# Patient Record
Sex: Male | Born: 1970 | Race: Black or African American | Hispanic: No | Marital: Single | Smoking: Never smoker
Health system: Southern US, Community
[De-identification: ages and names within clinical notes are randomized; demographics above are authoritative.]

## PROBLEM LIST (undated history)

## (undated) DIAGNOSIS — G4733 Obstructive sleep apnea (adult) (pediatric): Secondary | ICD-10-CM

## (undated) DIAGNOSIS — IMO0002 Reserved for concepts with insufficient information to code with codable children: Secondary | ICD-10-CM

## (undated) DIAGNOSIS — R002 Palpitations: Secondary | ICD-10-CM

## (undated) DIAGNOSIS — K219 Gastro-esophageal reflux disease without esophagitis: Secondary | ICD-10-CM

## (undated) DIAGNOSIS — I340 Nonrheumatic mitral (valve) insufficiency: Secondary | ICD-10-CM

## (undated) DIAGNOSIS — E669 Obesity, unspecified: Secondary | ICD-10-CM

## (undated) DIAGNOSIS — I10 Essential (primary) hypertension: Secondary | ICD-10-CM

## (undated) DIAGNOSIS — B019 Varicella without complication: Secondary | ICD-10-CM

## (undated) DIAGNOSIS — I5189 Other ill-defined heart diseases: Secondary | ICD-10-CM

## (undated) DIAGNOSIS — J45909 Unspecified asthma, uncomplicated: Secondary | ICD-10-CM

## (undated) DIAGNOSIS — F329 Major depressive disorder, single episode, unspecified: Secondary | ICD-10-CM

## (undated) DIAGNOSIS — F32A Depression, unspecified: Secondary | ICD-10-CM

## (undated) DIAGNOSIS — Z9289 Personal history of other medical treatment: Secondary | ICD-10-CM

## (undated) DIAGNOSIS — F419 Anxiety disorder, unspecified: Secondary | ICD-10-CM

## (undated) HISTORY — DX: Major depressive disorder, single episode, unspecified: F32.9

## (undated) HISTORY — DX: Unspecified asthma, uncomplicated: J45.909

## (undated) HISTORY — DX: Varicella without complication: B01.9

## (undated) HISTORY — DX: Obstructive sleep apnea (adult) (pediatric): G47.33

## (undated) HISTORY — DX: Reserved for concepts with insufficient information to code with codable children: IMO0002

## (undated) HISTORY — DX: Nonrheumatic mitral (valve) insufficiency: I34.0

## (undated) HISTORY — DX: Obesity, unspecified: E66.9

## (undated) HISTORY — DX: Gastro-esophageal reflux disease without esophagitis: K21.9

## (undated) HISTORY — DX: Depression, unspecified: F32.A

## (undated) HISTORY — PX: CARDIAC CATHETERIZATION: SHX172

## (undated) HISTORY — DX: Palpitations: R00.2

## (undated) HISTORY — DX: Other ill-defined heart diseases: I51.89

## (undated) HISTORY — DX: Anxiety disorder, unspecified: F41.9

## (undated) HISTORY — DX: Personal history of other medical treatment: Z92.89

## (undated) HISTORY — DX: Essential (primary) hypertension: I10

---

## 1997-07-19 ENCOUNTER — Emergency Department (HOSPITAL_COMMUNITY): Admission: EM | Admit: 1997-07-19 | Discharge: 1997-07-19 | Payer: Self-pay | Admitting: Emergency Medicine

## 2000-12-01 ENCOUNTER — Ambulatory Visit (HOSPITAL_BASED_OUTPATIENT_CLINIC_OR_DEPARTMENT_OTHER): Admission: RE | Admit: 2000-12-01 | Discharge: 2000-12-01 | Payer: Self-pay | Admitting: Otolaryngology

## 2002-11-24 ENCOUNTER — Emergency Department (HOSPITAL_COMMUNITY): Admission: EM | Admit: 2002-11-24 | Discharge: 2002-11-24 | Payer: Self-pay

## 2004-11-19 ENCOUNTER — Encounter: Admission: RE | Admit: 2004-11-19 | Discharge: 2004-11-19 | Payer: Self-pay | Admitting: Nephrology

## 2005-03-05 ENCOUNTER — Ambulatory Visit (HOSPITAL_COMMUNITY): Admission: RE | Admit: 2005-03-05 | Discharge: 2005-03-05 | Payer: Self-pay | Admitting: Otolaryngology

## 2005-03-12 ENCOUNTER — Ambulatory Visit (HOSPITAL_BASED_OUTPATIENT_CLINIC_OR_DEPARTMENT_OTHER): Admission: RE | Admit: 2005-03-12 | Discharge: 2005-03-12 | Payer: Self-pay | Admitting: Otolaryngology

## 2005-03-15 ENCOUNTER — Ambulatory Visit: Payer: Self-pay | Admitting: Otolaryngology

## 2005-05-11 ENCOUNTER — Ambulatory Visit: Payer: Self-pay | Admitting: Cardiology

## 2005-05-13 ENCOUNTER — Encounter (INDEPENDENT_AMBULATORY_CARE_PROVIDER_SITE_OTHER): Payer: Self-pay | Admitting: *Deleted

## 2005-05-13 ENCOUNTER — Encounter: Payer: Self-pay | Admitting: Internal Medicine

## 2005-05-13 ENCOUNTER — Inpatient Hospital Stay (HOSPITAL_COMMUNITY): Admission: RE | Admit: 2005-05-13 | Discharge: 2005-05-15 | Payer: Self-pay | Admitting: Otolaryngology

## 2005-05-20 ENCOUNTER — Emergency Department (HOSPITAL_COMMUNITY): Admission: EM | Admit: 2005-05-20 | Discharge: 2005-05-20 | Payer: Self-pay | Admitting: Emergency Medicine

## 2005-05-21 ENCOUNTER — Ambulatory Visit: Payer: Self-pay | Admitting: Cardiology

## 2005-05-27 ENCOUNTER — Ambulatory Visit: Payer: Self-pay | Admitting: Cardiology

## 2005-05-27 ENCOUNTER — Ambulatory Visit (HOSPITAL_COMMUNITY): Admission: RE | Admit: 2005-05-27 | Discharge: 2005-05-27 | Payer: Self-pay | Admitting: Cardiology

## 2005-06-03 ENCOUNTER — Ambulatory Visit: Payer: Self-pay | Admitting: Cardiology

## 2006-08-12 ENCOUNTER — Ambulatory Visit (HOSPITAL_BASED_OUTPATIENT_CLINIC_OR_DEPARTMENT_OTHER): Admission: RE | Admit: 2006-08-12 | Discharge: 2006-08-12 | Payer: Self-pay | Admitting: Nephrology

## 2006-08-15 ENCOUNTER — Ambulatory Visit: Payer: Self-pay | Admitting: Internal Medicine

## 2006-08-28 ENCOUNTER — Emergency Department (HOSPITAL_COMMUNITY): Admission: EM | Admit: 2006-08-28 | Discharge: 2006-08-28 | Payer: Self-pay | Admitting: Emergency Medicine

## 2006-12-29 ENCOUNTER — Emergency Department (HOSPITAL_COMMUNITY): Admission: EM | Admit: 2006-12-29 | Discharge: 2006-12-29 | Payer: Self-pay | Admitting: Emergency Medicine

## 2007-01-23 ENCOUNTER — Emergency Department (HOSPITAL_COMMUNITY): Admission: EM | Admit: 2007-01-23 | Discharge: 2007-01-24 | Payer: Self-pay | Admitting: Emergency Medicine

## 2007-04-20 ENCOUNTER — Ambulatory Visit: Payer: Self-pay | Admitting: Internal Medicine

## 2007-05-25 ENCOUNTER — Encounter: Payer: Self-pay | Admitting: Cardiology

## 2007-05-25 ENCOUNTER — Ambulatory Visit: Payer: Self-pay | Admitting: Cardiology

## 2008-01-13 HISTORY — PX: UVULOPALATOPHARYNGOPLASTY: SHX827

## 2008-02-17 ENCOUNTER — Ambulatory Visit: Payer: Self-pay | Admitting: Cardiology

## 2008-03-05 ENCOUNTER — Ambulatory Visit: Payer: Self-pay | Admitting: Cardiology

## 2008-03-05 LAB — CONVERTED CEMR LAB
ALT: 27 units/L (ref 0–53)
AST: 25 units/L (ref 0–37)
Albumin: 4.1 g/dL (ref 3.5–5.2)
Alkaline Phosphatase: 50 units/L (ref 39–117)
BUN: 23 mg/dL (ref 6–23)
Bilirubin, Direct: 0.1 mg/dL (ref 0.0–0.3)
CO2: 30 meq/L (ref 19–32)
Calcium: 9.4 mg/dL (ref 8.4–10.5)
Chloride: 102 meq/L (ref 96–112)
Cholesterol: 135 mg/dL (ref 0–200)
Creatinine, Ser: 1.1 mg/dL (ref 0.4–1.5)
GFR calc Af Amer: 96 mL/min
GFR calc non Af Amer: 79 mL/min
Glucose, Bld: 100 mg/dL — ABNORMAL HIGH (ref 70–99)
HDL: 37.4 mg/dL — ABNORMAL LOW (ref >39.0)
LDL Cholesterol: 89 mg/dL (ref 0–99)
Potassium: 4 meq/L (ref 3.5–5.1)
Sodium: 138 meq/L (ref 135–145)
Total Bilirubin: 1 mg/dL (ref 0.3–1.2)
Total CHOL/HDL Ratio: 3.6
Total Protein: 7 g/dL (ref 6.0–8.3)
Triglycerides: 44 mg/dL (ref 0–149)
VLDL: 9 mg/dL (ref 0–40)

## 2008-08-29 ENCOUNTER — Encounter (INDEPENDENT_AMBULATORY_CARE_PROVIDER_SITE_OTHER): Payer: Self-pay | Admitting: *Deleted

## 2009-01-11 ENCOUNTER — Telehealth: Payer: Self-pay | Admitting: Nurse Practitioner

## 2010-03-26 DIAGNOSIS — I10 Essential (primary) hypertension: Secondary | ICD-10-CM | POA: Insufficient documentation

## 2010-03-26 DIAGNOSIS — E669 Obesity, unspecified: Secondary | ICD-10-CM | POA: Insufficient documentation

## 2010-03-26 DIAGNOSIS — G4730 Sleep apnea, unspecified: Secondary | ICD-10-CM | POA: Insufficient documentation

## 2010-03-26 DIAGNOSIS — R002 Palpitations: Secondary | ICD-10-CM | POA: Insufficient documentation

## 2010-03-26 DIAGNOSIS — R079 Chest pain, unspecified: Secondary | ICD-10-CM | POA: Insufficient documentation

## 2010-03-26 DIAGNOSIS — I491 Atrial premature depolarization: Secondary | ICD-10-CM | POA: Insufficient documentation

## 2010-03-27 ENCOUNTER — Encounter: Payer: Self-pay | Admitting: Cardiology

## 2010-03-27 ENCOUNTER — Other Ambulatory Visit: Payer: Self-pay | Admitting: Cardiology

## 2010-03-27 ENCOUNTER — Ambulatory Visit (INDEPENDENT_AMBULATORY_CARE_PROVIDER_SITE_OTHER): Payer: 59 | Admitting: Cardiology

## 2010-03-27 DIAGNOSIS — I10 Essential (primary) hypertension: Secondary | ICD-10-CM

## 2010-03-27 LAB — BASIC METABOLIC PANEL
BUN: 15 mg/dL (ref 6–23)
CO2: 27 mEq/L (ref 19–32)
Calcium: 9.6 mg/dL (ref 8.4–10.5)
Chloride: 103 mEq/L (ref 96–112)
Creatinine, Ser: 1.2 mg/dL (ref 0.4–1.5)
GFR: 87.38 mL/min (ref >60.00)
Glucose, Bld: 107 mg/dL — ABNORMAL HIGH (ref 70–99)
Potassium: 4.2 mEq/L (ref 3.5–5.1)
Sodium: 138 mEq/L (ref 135–145)

## 2010-03-27 LAB — LIPID PANEL
Cholesterol: 160 mg/dL (ref 0–200)
HDL: 41 mg/dL (ref >39.00)
LDL Cholesterol: 112 mg/dL — ABNORMAL HIGH (ref 0–99)
Total CHOL/HDL Ratio: 4
Triglycerides: 35 mg/dL (ref 0.0–149.0)
VLDL: 7 mg/dL (ref 0.0–40.0)

## 2010-04-01 NOTE — Assessment & Plan Note (Signed)
Summary: F1Y.per pt call=mj  Medications Added CARVEDILOL 6.25 MG TABS (CARVEDILOL) take one tablet p.o. once a day AMLODIPINE BESYLATE 10 MG TABS (AMLODIPINE BESYLATE) Take one tablet by mouth daily FISH OIL 1000 MG CAPS (OMEGA-3 FATTY ACIDS) one capsule 4-5-x a week ARGININE 500 MG TABS (ARGININE) about 4-5 x a week L-CITRULLINE 600 MG CAPS (CITRULLINE) 4-5- x a week L-GLUTAMIC ACID  POWD (GLUTAMIC ACID) 500mg  4-5x a week * AMINOACID POWDER 5000mg  4-5-x a week ASPIRIN 81 MG TBEC (ASPIRIN) couple times a week BYSTOLIC 5 MG TABS (NEBIVOLOL HCL) one daily      Allergies Added: NKDA  Visit Type:  1 year follow up  CC:  Pt. takes carvedilol once a day. Due to side effects- Palpitations.  History of Present Illness: 40 yo with history of hypertension with LVH and OSA presents for cardiology followup.  BP today is 140/98.  He has been doing well symptomatically. He runs on a treadmill for 20 minutes, does sprints, and uses an elliptical machine, all 4-5 times a week. No exertional dyspnea or shortness of breath.  He is using his CPAP.  No further palpitations.  He does not remember to take his Coreg twice daily (only takes it in the morning).    ECG: NSR, nonspecific T wave changes   Current Medications (verified): 1)  Carvedilol 6.25 Mg Tabs (Carvedilol) .... Take One Tablet P.o. Once A Day 2)  Avalide 150-12.5 Mg Tabs (Irbesartan-Hydrochlorothiazide) .... Take One Tablet Once Daily 3)  Amlodipine Besylate 10 Mg Tabs (Amlodipine Besylate) .... Take One Tablet By Mouth Daily 4)  Fish Oil 1000 Mg Caps (Omega-3 Fatty Acids) .... One Capsule 4-5-X A Week 5)  Arginine 500 Mg Tabs (Arginine) .... About 4-5 X A Week 6)  L-Citrulline 600 Mg Caps (Citrulline) .... 4-5- X A Week 7)  L-Glutamic Acid  Powd (Glutamic Acid) .... 500mg  4-5x A Week 8)  Aminoacid Powder .... 5000mg  4-5-X A Week 9)  Aspirin 81 Mg Tbec (Aspirin) .... Couple Times A Week  Allergies (verified): No Known Drug  Allergies  Past History:  Past Medical History: 1. Hypertension. 2. Palpitations.  Event monitor showed PACs, PVCs, and did show a brief run of wide complex tachycardia, which was questionable whether this was aberrantly conducted supraventricular tachycardia versus a ventricular tachycardia.  It was a very short run and management was not changed as result of this. 3. Obstructive sleep apnea status post uvuloplasty.  The patient does use CPAP. 4. Perioperative troponin elevation in 2007 after the patient's uvuloplasty.  He did have some chest pain at the time as well.  Left heart catheterization at that time showed normal coronary arteries and normal renal arteries, EF was 50%. 5. Obesity. 6. Echocardiogram (5/09) showed EF 50-55%, mild LV dilation, mild LV hypertrophy, pseudonormal diastolic function, mild mitral regurgitation, and mild left atrial enlargement.  Family History: The patient reports extensive history of early onset hypertension in his family.  Social History: The patient is nonsmoker, lives in [place], employed full-time as Public affairs consultant.    Review of Systems       All systems reviewed and negative except as per HPI.   Vital Signs:  Patient profile:   40 year old male Height:      70 inches Weight:      264 pounds BMI:     38.02 Pulse rate:   58 / minute Pulse rhythm:   regular Resp:     18 per minute BP sitting:   140 / 98  (left arm) Cuff size:   large  Vitals Entered By: Vikki Ports (March 27, 2010 8:57 AM)  Physical Exam  General:  Well developed, well nourished, in no acute distress. Neck:  Neck supple, no JVD. No masses, thyromegaly or abnormal cervical nodes. Lungs:  Clear bilaterally to auscultation and percussion. Heart:  Non-displaced PMI, chest non-tender; regular rate and rhythm, S1, S2 without murmurs, rubs or gallops. Carotid upstroke  normal, no bruit.  Pedals normal pulses. No edema, no varicosities. Abdomen:  Bowel sounds positive; abdomen soft and non-tender without masses, organomegaly, or hernias noted. No hepatosplenomegaly. Extremities:  No clubbing or cyanosis. Neurologic:  Alert and oriented x 3. Psych:  Normal affect.   Impression & Recommendations:  Problem # 1:  HYPERTENSION (ICD-401.9) BP is better than prior but still a bit elevated in the office.  I will have him stop Coreg, since he is not taking it properly,  and start Bystolic 5 mg daily. BP check in 2 weeks.  BMET today given use of irbesartan/HCTZ.  Given mildly abnormal echo when last done, I will repeat an echo.   Problem # 2:  PREMATURE ATRIAL CONTRACTIONS (ICD-427.61) Minimal recent palpitations.  Replacing Coreg with Bystolic.   Other Orders: Echocardiogram (Echo) TLB-BMP (Basic Metabolic Panel-BMET) (80048-METABOL) TLB-Lipid Panel (80061-LIPID)  Patient Instructions: 1)  Your physician has recommended you make the following change in your medication:  2)  Stop coreg(carvedilol). 3)  Start Bystolic 5mg  daily. 4)  Take and record your blood pressure about 2 hours after you take your medication. I will call you in 2 weeks to get the readings. Luana Shu  5)  Your physician recommends that you return for a FASTING lipid profile/BMP today--401.9 6)  Your physician has requested that you have an echocardiogram.  Echocardiography is a painless test that uses sound waves to create images of your heart. It provides your doctor with information about the size and shape of your heart and how well your heart's chambers and valves are working.  This procedure takes approximately one hour. There are no restrictions for this procedure. 7)  Your physician wants you to follow-up in: 1 year with Dr Shirlee Latch.[facility] 2013) You will receive a reminder letter in the mail two months in advance. If you don't receive a letter, please call our office to schedule the  follow-up appointment. Prescriptions: BYSTOLIC 5 MG TABS (NEBIVOLOL HCL) one daily  #30 x 11   Entered by:   Katina Dung, RN, BSN   Authorized by:   Marca Ancona, MD   Signed by:   Katina Dung, RN, BSN on 03/27/2010   Method used:   Electronically to        [facility]  [address] [phone]* (retail)       [address]       [place], [place]  [address]       Ph:  [phone] or [phone]       Fax: [phone]   RxID:   [id number]

## 2010-04-08 ENCOUNTER — Other Ambulatory Visit (HOSPITAL_COMMUNITY): Payer: Self-pay | Admitting: Radiology

## 2010-04-08 DIAGNOSIS — R0602 Shortness of breath: Secondary | ICD-10-CM

## 2010-04-08 DIAGNOSIS — R002 Palpitations: Secondary | ICD-10-CM

## 2010-04-09 ENCOUNTER — Ambulatory Visit (HOSPITAL_COMMUNITY): Payer: 59 | Attending: Cardiology | Admitting: Radiology

## 2010-04-09 DIAGNOSIS — R002 Palpitations: Secondary | ICD-10-CM

## 2010-04-09 DIAGNOSIS — I10 Essential (primary) hypertension: Secondary | ICD-10-CM | POA: Insufficient documentation

## 2010-04-15 ENCOUNTER — Telehealth: Payer: Self-pay | Admitting: *Deleted

## 2010-04-15 NOTE — Telephone Encounter (Signed)
Pt given results of echo done 04/09/10 reviewed by Dr Shirlee Latch. Report forwarded to HIM to be scanned into Epic

## 2010-05-27 NOTE — Procedures (Signed)
Samuel Wiley, MOSKAL                ACCOUNT NO.:  [id number]   MEDICAL RECORD NO.:  [id number]           PATIENT TYPE:  OUT   LOCATION:  SLEEP CENTER                 FACILITY:  [facility]   PHYSICIAN:  Clinton D. Maple [name], MD, FCCP, FACPDATE OF BIRTH:  December 04, 1968   DATE OF STUDY:  08/12/2006                            NOCTURNAL POLYSOMNOGRAM   REFERRING PHYSICIAN:  Jarome Matin, M.D.   INDICATION FOR STUDY:  Hypersomnia with sleep apnea.   EPWORTH SLEEPINESS SCORE:  9/24, BMI 37, weight 262 pounds.   MEDICATIONS:  Home medications are listed and reviewed.   A diagnostic NPSG on March 12, 2005, recorded an APNEA-HYPOPNEA INDEX of  114 per hour.  CPAP titration protocol is requested.   SLEEP ARCHITECTURE:  Total sleep time 379 minutes with sleep efficiency  95%.  Stage I was 4%, stage II 76%, stage III absent, REM 20% total  sleep time, sleep latency 2 minutes, REM latency 31 minutes, awake after  sleep onset 20 minutes, arousal index 8.1.  Norvasc was taken at 2100  hours.   RESPIRATORY DATA:  CPAP titration protocol.  CPAP was titrated to 18  CWP, apnea-hypopnea index 0 per hour.  A medium ResMed Mirage Quattro  full-face mask was used with heated humidifier.   OXYGEN DATA:  Very loud snoring before CPAP control.  Oxygenation with  CPAP was at 97% saturation on room air.   CARDIAC DATA:  Sinus rhythm with PVCs.   MOVEMENT-PARASOMNIA:  Occasional limb jerk, insignificant.   IMPRESSIONS-RECOMMENDATIONS:  1. Successful CPAP titration to 18 centimeters of water pressure,      apnea-hypopnea index 0 per hour.  A medium ResMed Mirage Quattro      full-face mask was used with heated humidifier.  2. Baseline diagnostic nocturnal polysomnogram on March 12, 2005,      recorded an apnea-hypopnea index of 114 per hour.      Clinton D. Maple [name], MD, [facility], FACP  Diplomate, Biomedical engineer of Sleep Medicine  Electronically Signed     CDY/MEDQ  D:  08/15/2006 10:21:02  T:  08/15/2006  12:48:59  Job:  [id number]

## 2010-05-27 NOTE — Assessment & Plan Note (Signed)
[facility]                            CARDIOLOGY OFFICE NOTE   NAME:Samuel Wiley, Samuel Wiley                       MRN:          [id number]  DATE:02/17/2008                            DOB:          January 05, 1971    PRIMARY CARE PHYSICIAN:  Jarome Matin, MD   HISTORY OF PRESENT ILLNESS:  This is a 40 year old with a history of  hypertension, obstructive sleep apnea, and perioperative cardiac enzyme  elevation followed by a normal left heart catheterization who presents  to Cardiology Clinic for followup.  The patient has been seen by Dr.  Diona Browner, this is the first time I am seeing him.  He is actually had  been doing quite well since he was last seen by Dr. Diona Browner.  He is  active.  He is able to do aerobic exercise and to do all and to do  moderate to heavy exertion with no significant dyspnea or chest pain.  He really has no limitations.  He does have frequent PACs on his EKG  this morning.  However, he denies any episodes of lightheadedness and he  does not feel palpitations.  He also has no history of syncope.  Blood  pressure is borderline today at 140/82.   PAST MEDICAL HISTORY:  1. Hypertension.  2. Palpitations.  Event monitor showed PACs, PVCs, and did show a      brief run of wide complex tachycardia, which was questionable      whether this was aberrantly conducted supraventricular tachycardia      versus a ventricular tachycardia.  It was a very short run and      management was not changed as result of this.  3. Obstructive sleep apnea status post uvuloplasty.  The patient does      use CPAP.  4. Perioperative troponin elevation in 2007 after the patient's      uvuloplasty.  He did have some chest pain at the time as well.      Left heart catheterization at that time showed normal coronary      arteries and normal renal arteries, EF was 50%.  5. Obesity.  6. Echocardiogram, May 2009, that showed EF 50-55%, mild LV dilation,      mild  low LV hypertrophy, pseudonormal diastolic function, mild      mitral regurgitation, and mild left atrial enlargement.   MEDICATIONS:  Norvasc 10 mg daily, Coreg 6.25 mg b.i.d., irbesartan/HCTZ  75/6.25 daily.   FAMILY HISTORY:  The patient reports extensive history of early onset  hypertension in his family.   SOCIAL HISTORY:  The patient is nonsmoker.   EKG was reviewed today, shows normal sinus rhythm with PACs.  Most  recent labs were back in February 2009, creatinine 1.3, LDL 73, HDL 41.   PHYSICAL EXAMINATION:  VITAL SIGNS:  Blood pressure 140/82, heart rate  65 and regular.  GENERAL:  This is a well-developed male in no apparent distress.  NEUROLOGIC:  She is alert and oriented x3.  Normal affect.  LUNGS:  Clear to auscultation bilaterally.  Normal respiratory effort.  NECK:  No  JVD.  No thyromegaly or thyroid nodule.  CARDIOVASCULAR:  Heart regular S1 and S2.  No S3, soft S4.  No murmur.  EXTREMITIES:  No peripheral edema, 2+ posterior tibial pulses  bilaterally.  Normal carotid upstrokes.  ABDOMEN:  Soft, nontender.  No hepatosplenomegaly.  Normal bowel sounds.  EXTREMITIES:  No clubbing or cyanosis.   ASSESSMENT AND PLAN:  This is a 40 year old with history of  hypertension, obstructive sleep apnea, and borderline LV function on  echocardiogram who presents to Cardiology Clinic for followup.  1. Hypertension.  The patient does have a strong family history of      hypertension.  He did not have renal artery stenosis at the time of      his heart catheterization.  I do think this is probably early onset      essential hypertension.  His blood pressure is borderline at      140/82.  Continue his Norvasc and his Coreg ant current dose and      increase irbesartan/HCTZ to 150/12.5 daily.  He will come back in 2      weeks to get his blood pressure checked and also for CHEM-7 and      lipid check.  2. Premature atrial contractions.  The patient does have frequent       premature atrial contractions.  He does not note any palpitations.      He is on carvedilol.  3. Borderline left ventricular function.  The patient did have an      echocardiogram showing borderline left ventricular systolic      function, ejection fraction 50-55% with mild left ventricular      dilation and mild left ventricular hypertrophy, as well as      pseudonormal diastolic function.  These changes on the echo may be      due to the patient's hypertension.  We are aggressively treating      his hypertension and we will follow up an echo in about a year      after the last one which will be May 2010, to just to make sure      there has not been any worsening function and hopefully to see some      improvement.  4. I will see the patient back in the office in about 6 months.     Marca Ancona, MD  Electronically Signed    DM/MedQ  DD: 02/17/2008  DT: 02/17/2008  Job #: [id number]   cc:   Jarome Matin, M.D.

## 2010-05-27 NOTE — Assessment & Plan Note (Signed)
[facility]                            CARDIOLOGY OFFICE NOTE   NAME:Samuel Wiley, Samuel Wiley                       MRN:          [id number]  DATE:04/20/2007                            DOB:          10-Sep-1970    PRIMARY CARE PHYSICIAN:  Dr. Jeri Cos.   PRIMARY CARDIOLOGIST:  Dr. Simona Huh.   HISTORY OF PRESENT ILLNESS:  This is a 40 year old African American male  patient of Dr. Diona Browner who has a history of hypertension, obesity and  ended up having chest pain and minor to moderate elevation in a postop  setting after undergoing uvuloplasty for severe obstructive sleep apnea  in 2007.  He underwent cardiac cath which revealed normal coronary  arteries.  Normal bilateral renal arteries, EF 50%.  It was unclear what  the etiology of this postop chest pain and mild troponin elevation was  due to.   The patient comes in today complaining of fluctuating blood pressures  and palpitations.  He says his blood pressures in the morning can range  from 136-139/78-110 and 4 hours after he take his medicine, they are 106-  126/50-80.  His palpitations:  He says when he feels his pulse he can  feel it skipping, but he also has sensations of racing on occasion that  last 2-3 seconds.  He has no associated dizziness, shortness of breath,  diaphoresis or presyncopal symptoms.  The patient has had a 50 pounds  weight loss over the past 4 months because he says he is under a lot of  stress and basically stopped eating.  He denies taking any dietary  supplements or caffeine use or abuse or steroids.  He did have full  history and physical and blood work by Dr. Bascom Levels within the past month  which he was told everything was normal.   CURRENT MEDICATIONS:  1. Aspirin 81 mg daily.  2. Multivitamin daily.  3. Norvasc 10 mg daily.  4. Clonidine 0.2 mg b.i.d.  5. Hydrochlorothiazide, he does not know the dosage.  6. Fish oil.  7. Glutamine.  8. Alpha lipoic  acid.  9. L carnitine.   PHYSICAL EXAMINATION:  GENERAL:  This is a pleasant 40 year old African  American male in no acute distress.  VITAL SIGNS:  Blood pressure 136/88, pulse 78, weight 215.  Last office  visit he weighed 263 pounds in May 2007.  NECK:  Without JVD, HJR, bruit or thyroid enlargement.  LUNGS:  Clear anterior posterior and lateral.  HEART:  Regular rate and rhythm at 78 beats per minute, normal S1-S2.  No murmur, rub, bruit, thrill or heave noted.  ABDOMEN:  Soft without organomegaly, masses, lesions or abnormal  tenderness.  EXTREMITIES:  Without cyanosis, clubbing or edema.  He has good distal  pulses.   STUDIES:  EKG sinus rhythm with PACs and PVCs.   IMPRESSION:  1. Palpitations with evidence of PACs and PVCs on EKG.  2. Hypertension fluctuating.  3. History of chest pain and bump in troponins.  4. Postop nasal surgery for severe sleep apnea.  5. Cardiac catheterization revealed normal coronary arteries,  normal      LV function in May 2007.  6. A 50 pound weight loss due to stress and anorexia in the past 4      months.   PLAN:  At this time, I have opted to put an event recorder on this  patient to see what arrhythmias he is having.  We will call Dr.  Janey Greaser office to obtain what blood work he has had.  He is to call us  with his hydrochlorothiazide dose which we may increase for better blood  pressure control, and I have given him samples of Bystolic 5 mg to start  after he has worn event recorder for a couple of weeks.  He will then  follow up with Dr. Diona Browner.      Jacolyn Reedy, PA-C  Electronically Signed      Noralyn Pick. Eden Emms, MD, [facility]  Electronically Signed   ML/MedQ  DD: 04/20/2007  DT: 04/20/2007  Job #: [id number]

## 2010-05-27 NOTE — Assessment & Plan Note (Signed)
[facility]                            CARDIOLOGY OFFICE NOTE   NAME:Samuel Wiley, Samuel Wiley                       MRN:          [id number]  DATE:05/25/2007                            DOB:          1970-05-15    PRIMARY CARE PHYSICIAN:  Jarome Matin, M.D.   REASON FOR VISIT:  Palpitations, hypertension and follow-up recent  testing.   HISTORY OF PRESENT ILLNESS:  I last saw Samuel Wiley in the office back in  May 2007.  He has a history of minor troponin-I elevation (type 2 non-ST-  elevation myocardial infarction) following uvuloplasty for severe  obstructive sleep apnea back in 2007.  This was on a baseline of  hypertension.  He ultimately underwent a Myoview study which was  abnormal and this was followed by a cardiac catheterization also in May  2007, which revealed angiographically normal coronary arteries as well  as angiographically normal renal arteries and a left ventricular  ejection fraction of 50%.  Medical therapy was recommended and we have  not seen him back in the office until recently.   Samuel Wiley saw [name] on April 20, 2007.  He presented at that  time describing fluctuation in blood pressure, specifically with  generally more problems with hypertension on his regular medications as  well as a sense of palpitations.  It was noted that he had had a  significant weight loss of 50 pounds over the last several months.   In exploring this a little further, Samuel Wiley states that he was  experiencing bad dreams, symptoms of depression and very poor appetite  beginning back in January.  He states that he went weeks without eating  much of anything.  At this point, he is eating two meals a day,  essentially oatmeal and fruit with water in the morning and then chicken  and fruit in the evening.  His weight is 214 pounds, down from 263  pounds in 2007.  He is also exercising regularly, doing vigorous weight  workouts and treadmill  exercise without any symptoms of chest pain or  breathlessness.  He states that he feels very good when he is  exercising.  He checks his blood pressure at home and states that it  does fluctuate, tending to be higher later in the day.  He reports  compliance with medications and also denies any tobacco use, alcohol  use, illicit substance use or significant caffeine use.  He also denies  using any dietary supplements or energy drinks.  He reports a sense of  palpitations but no frank dizziness or syncope.  He was referred for an  event recorder by [name], and I reviewed these tracings.  The  patient has occasional premature atrial and ventricular complexes.  He  did have one brief episode of wide complex tachycardia either  ventricular or perhaps aberrantly conducted supraventricular arrhythmia.  It is not entirely clear that this was symptomatic, however.  He has  been subsequent referred for an echocardiogram that was done earlier  this morning.  I reviewed this study which indicates a left  ventricular  ejection fraction of 50-55%.  He does have evidence of both mild left  ventricular hypertrophy and mild left ventricular enlargement.  His left  atrium is mildly enlarged and he has mild mitral regurgitation.   I reviewed the testing and implications of long-term hypertension with  the patient.  We also discussed appropriate diet, sodium restriction and  the fact that he had a very reassuring ischemic assessment via  angiography just 2 years ago.  We talked about proceeding with  medication adjustments and he was interested in this.   ALLERGIES:  No known drug allergies.  He he does report problems with  cough on ACE INHIBITORS previously.   CURRENT MEDICATIONS:  1. Aspirin 81 mg p.o. daily.  2. Multivitamin one p.o. daily.  3. Norvasc 10 mg p.o. daily.  4. Clonidine 0.2 mg p.o. b.i.d.  5. Hydrochlorothiazide 25 mg p.o. daily.  6. Omega III supplements.  7. Glutamine.   8. L carnitine.  9. Alpha lipoic acid  10.He uses Ambien p.r.n. in the evenings.   REVIEW OF SYSTEMS:  As outlined above.  He does complain with problems  of insomnia.  States he had been under a lot of stress.  Otherwise  systems are negative.   PHYSICAL EXAMINATION:  VITAL SIGNS:  Blood pressure today is 150/82,  heart rate is 60, weight 214 pounds.  GENERAL APPEARANCE:  The patient is in no acute distress, well-nourished-  appearing, muscular.  HEENT:  Conjunctivae normal.  Oropharynx clear.  NECK:  Supple.  No elevated jugular venous pressure, no loud bruits.  No  thyromegaly is noted.  LUNGS:  Clear without labored breathing.  CARDIOVASCULAR:  A regular rate and rhythm.  Soft systolic murmur at the  base, preserved S2, no S3 gallop, no pericardial rub.  ABDOMEN:  Soft, nontender, normoactive bowel sounds,  EXTREMITIES:  No pitting edema.  His pulses are 2+.  SKIN:  Warm and dry.  MUSCULOSKELETAL:  No kyphosis noted.  NEUROPSYCHIATRIC:  The patient is alert and oriented x3.  Affect is  somewhat unusual.   IMPRESSION/RECOMMENDATIONS:  1. Previously documented history of normal coronary arteries at      angiography in May 2007.  I doubt that ischemic heart disease has      any bearing on the present situation.  Would continue to recommend      basic risk factor modification strategies in this regard.  2. Palpitations with documented premature atrial and ventricular      complexes.  There was one brief episode of wide complex      tachycardia, asymptomatic.  This is in the setting of a left      ventricular ejection fraction of 50-55% as outlined above and no      documented dizziness or syncope.  Would anticipate observation and      medical therapy.  3. Hypertension, likely related to left ventricular hypertrophy and      mild ventricular dilatation.  We discussed sodium restriction.  In      reviewing his medications, we came up with the following plan.  He      will begin  carvedilol 6.25 mg p.o. b.i.d. and we will titrate him      off of clonidine entirely.  We will also replace his      hydrochlorothiazide with Avalide 150/12.5 mg 1/2 tablet p.o. daily.      He will continue his Norvasc.  He will continue to follow his blood  pressures at home and we will have him follow up for a formal blood      pressure check in 2 weeks.  At that time, he will also have a BMET.      I will see him back clinically in 1 month.  He will let us know in      the interim if he has any difficulties.     Jonelle Sidle, MD  Electronically Signed    SGM/MedQ  DD: 05/25/2007  DT: 05/25/2007  Job #: [id number]   cc:   Jarome Matin, M.D.

## 2010-05-30 NOTE — Cardiovascular Report (Signed)
NAMETAYSEAN, WAGER NO.:  [id number]   MEDICAL RECORD NO.:  [id number]            PATIENT TYPE:   LOCATION:                                 FACILITY:   PHYSICIAN:  Jonelle Sidle, M.D. [facility] OF BIRTH:   DATE OF PROCEDURE:  05/27/2005  DATE OF DISCHARGE:                              CARDIAC CATHETERIZATION   PRIMARY CARE PHYSICIAN:  Jarome Matin, M.D.   CARDIOLOGIST:  Jonelle Sidle, M.D. [facility]   INDICATIONS:  Mr. Fortin is a 40 year old male with history of obesity,  hypertension, and recent ear, nose, and throat surgery including  uvulopalatoplasty for severe obstructive sleep apnea.  In the postoperative  setting, he developed chest pain; and ultimately minor troponin I elevations  up to 0.11, suggestive of possible non-ST-elevation myocardial infarction.  This was followed with a Myoview study which revealed an ejection fraction  of 40% with apical thinning and fixed defects in the anterior and inferior  wall suggesting scar as well as some possible inferior and mid-to-distal  inferolateral ischemia.  He did have an echocardiogram, at that time,  however, that revealed normal left ventricular function at 60-65%.  Given  these discrepancies, and concerns for our clarifying the diagnosis of  underlying coronary disease, we discussed definitive diagnostic coronary  angiography including its risks and potential benefits.  After reviewing  this, the patient agreed to proceed and an informed consent was obtained.   PROCEDURES PERFORMED:  1.  Left heart catheterization  2.  Selective coronary angiography.  3.  Left ventriculography.  4.  Selective renal angiography.   ACCESS AND EQUIPMENT:  The area about the right femoral artery was  anesthetized with 1% lidocaine.  A 5-French sheath was placed in the right  femoral artery via the modified Seldinger technique.  Standard preformed 5-  [place] JL-4 and JR-4 catheters were used for selective  coronary angiography  and an angled pigtail catheter was used for left heart catheterization and  left ventriculography.  The JR-4 catheter was also used for selective renal  angiography.  All exchanges were made over wire and a total of 125 mL  Omnipaque were used.  The patient tolerated the procedure well without any  complications.   HEMODYNAMIC RESULTS:  Aorta 145/99 mmHg.  Left ventricle 146/14 mmHg.   ANGIOGRAPHIC FINDINGS:  1.  The left main coronary artery is free of significant flow-limiting      coronary sclerosis and gives rise to a large circumflex vessel as well      as left anterior descending.  2.  The left anterior descending is a medium to large caliber vessel with 4      proximal to mid septal perforators as well as 2 proximal to mid level      diagonal branches.  No significant flow-limiting coronary sclerosis is      noted within this system.  3.  The circumflex coronary artery is a large caliber vessel with 2 small      proximal obtuse marginal branches; and 2 larger bifurcating more distal      branches.  No significant flow-limiting coronary atherosclerosis is      noted within this system.  4.  The right coronary artery is a medium caliber vessel that is dominant      with posterior descending branch.  There are 2 smaller right ventricular      marginal branches and small posterolateral system.  No significant flow-      limiting coronary sclerosis noted.  5.  Selective bilateral renal angiography reveals normal renal arteries      bilaterally.   LEFT VENTRICULOGRAPHY:  Performed in the RAO projection and reveals an  ejection fraction of approximately 50% with no focal anterior/inferior wall  motion abnormality, and no significant mitral regurgitation.   DIAGNOSES:  1.  Angiographically normal coronary arteries.  2.  Angiographically normal bilateral renal arteries.  3.  Left ventricular ejection fraction of approximately 50% with no mitral       regurgitation, and a left ventricle end-diastolic pressure of 14 mmHg.   DISCUSSION:  I reviewed the results, in detail, with the patient.  He has  reassuring coronary anatomy; and it is not clear that the etiology of his  postoperative chest pain, and minor troponin I elevations were due to  coronary insufficiency, or a plaque rupture event.  His abnormal myocardial  perfusion study also does not correlate with the patient's normal coronary  anatomy, suggesting that this study may have been affected by significant  artifact.  In any event, at this point, I would anticipate ongoing medical  therapy for hypertension and general risk factor modification otherwise.  I  discussed this with the patient today, and we will plan to see him back for  routine assessment and then have him continue regular follow up with Dr.  Leretha Dykes.      Jonelle Sidle, M.D. [facility]  Electronically Signed     SGM/MEDQ  D:  05/27/2005  T:  05/27/2005  Job:  [id number]   cc:   Jarome Matin, M.D.  Fax: [phone]

## 2010-05-30 NOTE — Consult Note (Signed)
NAMENOELLE, HOOGLAND                ACCOUNT NO.:  [id number]   MEDICAL RECORD NO.:  [id number]          PATIENT TYPE:  OIB   LOCATION:  2921                         FACILITY:  [facility]   PHYSICIAN:  Jonelle Sidle, M.D. LHCDATE OF BIRTH:  03-18-1970   DATE OF CONSULTATION:  05/13/2005  DATE OF DISCHARGE:                                   CONSULTATION   REASON FOR CONSULTATION:  Chest pain.   HISTORY OF PRESENT ILLNESS:  Mr. Smolinsky is a 40 year old male with a reported  long-standing history of hypertension as well as recently diagnosed severe  obstructive sleep apnea.  He was evaluated with a nocturnal polysomnogram  back in March which was interpreted by Dr. Maple [name] which showed very severe  obstructive sleep apnea/hypopnea syndrome with markedly fragmented sleep  quality, loud snoring, and oxygen desaturation to a low of 80%.  He was  evaluated by Dr. Haroldine Laws and is now postop day one status post  uvulopalatoplasty tonsillectomy with a functional endoscopic sinus surgery,  bilateral maxillary sinus ostial enlargement, left ethmoidectomy, turbinate  reduction, and reduction of concha bullosa under general endotracheal  anesthesia.  He apparently had no difficulties during the operation with  systolic blood pressures ranging from 100 to 180 and heart rates in the 80s  to 100s range.  He had 1500 mL total in and approximately 700 mL total out.  While in the PACU recovering, he apparently experienced an episode of chest  pain which he describes to me now as if someone was pushing on his chest.  He had an electrocardiogram obtained around that time which showed  nonspecific ST-T wave changes predominantly in the inferolateral leads which  were described as being without significant change from preoperative tracing  (tracing not available).  He was treated with intravenous Lopressor,  sublingual nitroglycerin, and morphine, and ultimately had cessation of his  symptoms.  On my interview  now, he is pain free.   Mr. Mault tells me that he has had some episodes of chest pain when he over  exerts himself although this does not occur frequently.  He has also had  some chest pain recently at nighttime.  He has not undergone any prior  ischemic evaluation as best I can tell.  He states that he may have had some  type of heart testing as a child when he was diagnosed with hypertension,  but nothing more recently.   ALLERGIES:  No known drug allergies.   MEDICATIONS AT HOME:  Norvasc 10 mg p.o. daily, Clonidine 0.1 mg p.o.  b.i.d., enteric coated aspirin 81 mg p.o. daily, daily multi-vitamin, and  Ambien 5-10 mg p.o. q.h.s. p.r.n.   PAST MEDICAL HISTORY:  1.  Hypertension, reportedly since the age of 69, he has been on medications      for many years and has been followed by Dr. Bascom Levels.  Reported history      of childhood asthma.  History of hiatal hernia with some reflux disease.  2.  Recently diagnosed severe obstructive sleep apnea as outlined above.  3.  No reported history of coronary artery  disease or myocardial infarction.   SOCIAL HISTORY:  The patient is not married.  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  He denies any significant tobacco or alcohol use.  lives in [place] by himself.  He is a widower with three children.  He  previously worked as a Occupational psychologist.  He quit tobacco use back in  the 1980s and denies alcohol use.   FAMILY HISTORY:  Noncontributory for premature cardiovascular disease based  on patient description.  He states he does have some older family members  with hypertension and history of congestive heart failure.   REVIEW OF SYMPTOMS:  As described in the history of present illness.  He has  had no fevers, chills, cough, hemoptysis, melena, or hematochezia, anorexia,  bleeding problems, frank orthopnea, PND, palpitations, or syncope.   PHYSICAL EXAMINATION:  VITAL SIGNS:  The patient is afebrile, most recent blood pressure  142/78,  heart rate 100 in sinus rhythm, respirations 26, oxygen saturation 95%.  GENERAL:  This is an overweight but well developed male in no acute  distress, denying active chest pain.  NECK:  No loud bruits or elevated jugular venous pressure.  LUNGS:  Clear without labored breathing at rest.  HEART:  Regular rate and rhythm without pericardial rub, loud murmur, or S3  gallop.  The chest wall is nontender to palpation.  ABDOMEN:  Soft, with no hepatomegaly or bruits.  EXTREMITIES:  No significant pitting edema.  Distal pulses are 2+.  SKIN:  No ulcerative changes are noted.  MUSCULOSKELETAL:  No kyphosis is noted.  NEUROPSYCHIATRIC:  The patient is alert and oriented x 3.   LABORATORY DATA:  WBC 5.2, hemoglobin 15, hematocrit 44.4, platelets 262.  Sodium 140, potassium 4.6, chloride 105, bicarb 28, glucose 96, BUN 14,  creatinine 1.2.  CK 3465 with CK MB of 11.7 but normal relative index of  0.3, troponin I 0.08.  Urinalysis is normal. 12 lead electrocardiogram from  May 1 reports no acute cardiopulmonary disease process.   IMPRESSION:  1.  Recent episode of chest pain in the postoperative period, patient is now      pain free.  His electrocardiogram around the time of his symptoms showed      nonspecific inferolateral ST-T wave changes.  There is a long standing      history of hypertension.  No frank premature cardiovascular disease      noted in the family.  The patient's lipid status is not certain at this      time.  He denies any tobacco use history.  He has had some intermittent      episodes of chest pain as outlined above, although not as intense as the      one noted today.  He has had no recent cardiac risk stratification.      Initial troponin I level is minimally elevated although with normal CK      MB relative index.  Significantly elevated total CK likely secondary to      recent surgery from musculoskeletal source. 2.  Long-standing history of hypertension,  reportedly since the age of 50.   RECOMMENDATIONS:  1.  The patient has been moved to the unit for closer observation.  I would      recommend cycling a full set of cardiac markers and follow up      electrocardiogram.  Will reinstitute Norvasc 10 mg p.o. daily as well as      enteric coated aspirin  81 mg p.o. daily.  We will also begin Lopressor      at 25 mg p.o. b.i.d.  2.  A 2D echocardiogram will be arranged.  Will check fasting lipid profile,      as well.  3.  Ischemic testing will be required.  Based on the patient's clinical      status and above evaluation, we can better determine the course of      action, specifically whether he should undergo invasive cardiac testing      versus a non-invasive modality.  His recent surgery will also have some      impact on this decision and we will need to review this with Dr.      Haroldine Laws.  4.  Further plans to follow.   Progressive weakness, fatigue, and chest heaviness as described in an 14-  year-old male with known cardiovascular disease as outlined above.  Electrocardiogram is nonspecific at this point.  Initial cardiac markers are  pending.  1.  Peripheral vascular disease status post abdominal aortic aneurysm repair      in 1994.  2.  Previous history of atrial fibrillation as well as conduction system      disease.  Electrocardiogram looks to be stable.  3.  Hypertension.  4.  Previous history of transient ischemic attack.  5.  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   PLAN:  1.  Will admit the patient to the step down unit and continue to cycle      cardiac markers.  2.  Continue home medications with the addition of heparin.  I will also      check a blood gas on supplemental oxygen.  3.  The patient has been followed by Dr. Myrtis Ser in clinic.  His notes are      provided in the chart.  My suspicious is that we will need to proceed      with diagnostic coronary angiography in the  morning to sort out his      coronary and bypass graft anatomy given his symptoms.  4.  We can review this with Dr. Myrtis Ser, as well.  5.  Further plans to follow.           ______________________________  Jonelle Sidle, M.D. [facility]     SGM/MEDQ  D:  05/13/2005  T:  05/13/2005  Job:  [id number]   cc:   Jarome Matin, M.D.  Fax: [phone]   Hermelinda Medicus, M.D.  Fax: [phone]

## 2010-05-30 NOTE — Op Note (Signed)
Samuel Wiley, Samuel Wiley                ACCOUNT NO.:  [id number]   MEDICAL RECORD NO.:  [id number]          PATIENT TYPE:  OIB   LOCATION:  2921                         FACILITY:  [facility]   PHYSICIAN:  Hermelinda Medicus, M.D.   DATE OF BIRTH:  07-30-1970   DATE OF PROCEDURE:  05/13/2005  DATE OF DISCHARGE:                                 OPERATIVE REPORT   PREOPERATIVE DIAGNOSIS:  Sleep apnea with an RDI of 123 and O2 nadir of 80,  history of septal reconstruction and turbinate reduction but turbinate  hypertrophy with concha bullosa with bilateral maxillary and left ethmoid  sinusitis.   POSTOPERATIVE DIAGNOSIS:  Sleep apnea with an RDI of 123 and O2 nadir of 80,  history of septal reconstruction and turbinate reduction but turbinate  hypertrophy with concha bullosa with bilateral maxillary and left ethmoid  sinusitis.   OPERATION:  Functional endoscopic sinus surgery, bilateral maxillary sinus  ostial enlargement, left ethmoidectomy, reduction of turbinates,  reduction  of concha bullosa, and uvulopalatoplasty with tonsillectomy.   SURGEON:  Hermelinda Medicus, M.D.   ANESTHESIA:  General endotracheal anesthesia with Dr. Kipp Brood.   PROCEDURE:  The patient was placed in the supine position. Under general  orotracheal anesthesia, his nose was first approached where 200 mg of  topical cocaine was used and 1% Xylocaine with epinephrine was used for  anesthesia and to shrink the membranes. The inferior turbinates were  aggressively outfractured to gain further space within this very narrow  nasal vestibule. The L-Med bipolar cautery was also used to shrink the  membranes on these inferior turbinate region set at 12. This was cauterized  inferiorly and laterally in an effort to minimize crusting. The middle  turbinates, which were pushed up against the ethmoid sinus, were pushed  medial and then using the polyp forceps, we crushed the spherical structure  these to an airplane wing type  structure and oval structure and pushed them  medial to get them away from the natural ostium of the ethmoid sinus and the  maxillary sinus.  We then approached the ethmoid sinus using the 0 and 70  degree scopes and upbiting and straight Blakesley-Wilder forceps, and  removed considerable polypoid debris to get the sinus to drain adequately.  Once this was cleared, we then approached the left maxillary sinus where the  natural ostium could not be seen because it was so blocked up with scar  tissue. We could feel this with a curved suction and then using that as a  marker, using the 0 degree scope, we were able to increase its size  approximately five times its normal size using side-biting forceps.  Considerable mucoid material was suctioned from this sinus. On the right  side, a similar situation existed where we again had to feel the location of  the natural ostium with a curved suction as it was a pinhole type structure  and then we used a side biting forceps to open this to approximately five  times its normal size, suction mucus from the sinus as well as remove some  mucous membrane from this natural  ostium.  Once this was completed, we then  placed some Gelfoam within the ethmoid sinus on the left and Gelfilm within  the nose superiorly, to keep the middle turbinates medial. We then placed  Telfa within the nose. We switched our orientation to the tonsillar region  where we placed a tonsillar gag and then removed the tonsils which were  quite large and obstructive in a very small, shallow mild. Once we removed  these, we used blunt and Bovie coagulation dissection. All hemostasis was  established with Bovie coagulation and then the nasopharynx was suctioned  again, and then the uvula, which was approximately 3-4 times larger than its  normal size, was trimmed back to a normal size and the anterior-posterior  suturing was done using 5-0 plain catgut.  Once this was completed, we then   removed the Telfa from the nose, suctioned the nasopharynx, we suctioned the  stomach, placed anesthesia trumpets and on the right side, placed a Merocel  pack and then the patient was awakened, tolerated the procedure very well,  was doing well postop.  Blood loss approximately 100 mL.  His follow-up will  be in 3300 intensive care as an outpatient 23-hour observation and then in  my office in five days and then ten days and he is well aware of the risks  and gains of this especially the pain involved and the soft diet that he  needs to adhere to over the next 10-12 days. He also may not travel for this  time span and is aware of this. The patient tolerated procedure well and was  doing well postop.           ______________________________  Hermelinda Medicus, M.D.     JC/MEDQ  D:  05/13/2005  T:  05/13/2005  Job:  [id number]   cc:   Jarome Matin, M.D.  Fax: [phone]

## 2010-05-30 NOTE — Discharge Summary (Signed)
Samuel Wiley, Samuel Wiley                ACCOUNT NO.:  [id number]   MEDICAL RECORD NO.:  [id number]           PATIENT TYPE:   LOCATION:                                 FACILITY:   PHYSICIAN:  Hermelinda Medicus, M.D.        DATE OF BIRTH:   DATE OF ADMISSION:  DATE OF DISCHARGE:                                 DISCHARGE SUMMARY   This patient is a 40 year old male who weighs 119 kg and is 70 inches tall.  He came in with severe sleep apnea issues and had an RDI of 123 and his O2  nadir was 80%.  He was extremely sleep deprived, deep sleep apnea issues,  also has nasal obstruction.  He had septal surgery two years ago but the  turbinate reduction has not worked well for him and he continues to have  nasal obstruction and associated sinusitis.  CAT scan was done showing  turbinate hypertrophy with severe concha bullosae.  He also had left ethmoid  and bilateral maxillary sinusitis with persistent fluid levels.  He enters  for a uvulopalatoplasty, tonsillectomy, and functional endoscopic sinus  surgery, bilateral maxillary sinus ostial enlargement with a left  ethmoidectomy and turbinate reduction and reduction of concha bullosa under  endotracheal anesthesia and to be kept under observation because of the  sleep apnea issues.   ALLERGIES:  None.   MEDICATIONS:  He drinks alcohol occasionally.  He never smoked.  He has had  some teeth extracted in the past.  He does have hypertension and has not  seen a cardiologist.  He has not had a stress test.  He had asthma as a  child, but never as an adult.  Medications are Norvasc 10, clonidine 0.1 mg  p.o. twice a day.  Takes aspirin 81 mg per day, mega vitamins, and Ambien  for sleep as necessary.   PHYSICAL EXAMINATION:  VITAL SIGNS:  Blood pressure 171/91, height 70  inches, 119 kg, heart rate 73.  HEENT:  Ears are clear.  Oral cavity is essentially moderate in size;  however, he does have tonsils that are quite large.  Nose shows a septum to  be straight but he has a concha bullosa and large turbinates.  He has a left  thyroid mass which we are going to evaluate in the future once we resolve  his sleep apnea issues.  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  Neck is otherwise unremarkable.  His larynx, __________, true cords, false  cords, epiglottis, base of tongue are clear of ulceration or mass.  True  cord mobility, gag reflex, tongue mobility, EOMs, facial nerve are all  symmetrical.  He does have a fairly small narrow oral cavity.  CHEST:  Clear.  No rales, rhonchi, or wheezes.  CARDIOVASCULAR:  No murmurs or gallops, opening snaps or gallops.  EKG is  unremarkable.  ABDOMEN:  Unremarkable.  EXTREMITIES:  Unremarkable.   INITIAL DIAGNOSIS:  Sleep apnea with nasal obstruction with postoperative  septal reconstruction, turbinate reduction, persistent turbinate hypertrophy  with concha bullosae with associated  sinusitis, ethmoid left bilateral  maxillary.  Also, a left thyroid mass.  Our plan is for a UPP,  tonsillectomy, concha bullosa reduction, turbinate reduction, functional  endoscopic sinus surgery, bilateral maxillary sinus ostial enlargement with  removal of mucous membrane, and a left ethmoidectomy.  The patient did very  well during.  However, right after surgery he did have some chest pain where  he was evaluated by our anesthesiologist and also then by Dr. Simona Huh.  He was kept overnight and the laboratory tests were ordered.  He had  electrocardiogram which showed some non-specific ST and T-wave changes on  the inferolateral leads without significant change in the preoperative  tracing.  He was treated with some intravenous Lopressor, sublingual  nitroglycerin, and morphine and ultimately he had cessation of the symptoms.  A 2-D echocardiogram was arranged.  Ischemic testing was evaluated.  CPK-MB  was 11.7.  His troponin was 0.08.  The elevated troponin was of concern and  he was further  evaluated by Dr. Diona Browner.  His plan would be that once this  was healed that he would have further cardiac work-up including an  angiogram.  The plan would be to continue home medications, but yet once he  is healed of his surgery would add heparin and then plan would be for a  diagnostic coronary angiography after the surgical sites were more healed.   FAMILY HISTORY:  Noncontributory for premature cardiovascular disease.  He  does have some older family members who do have a history of hypertension  and history of congestive heart failure.   The patient did very well during postoperatively in the environment and he  was discharged on his second day.  He is feeling much better and has done  very well under observation at home and then with permission of cardiology  he was discharged for further care with his cardiac evaluation.  The only  abnormal laboratory that he had was the sinus rhythm, occasional premature  ventricular complexes, premature atrial complexes, nonspecific T-wave  abnormality.  He also had the elevated troponin.  His myocardial impression  was that of apical thinning and fixed defects of the anterior inferior wall  suggesting some scar, progression inferior defect in the mid to distal  infero and lateral wall on stress imaging, possible area of peri-infarct or  ischemia, global hypokinesis with decreased left ventricular ejection  fraction of 40%.  His laboratories showed persistently increased troponin  with a CPK-MB of 12.9 and a troponin of 0.11.  Cholesterol was 173.  Patient  has done very well postoperatively and has resolved his postoperative  surgical status and now is under the care of Dr. Diona Browner for his further  cardiac evaluation.   FINAL DIAGNOSES:  1.  Sleep apnea with nasal obstruction.  2.  History of sinusitis with concha bullosae with turbinate hypertrophy     with left ethmoid and bilateral maxillary sinusitis with history of      hypertension  under control.           ______________________________  Hermelinda Medicus, M.D.     JC/MEDQ  D:  06/02/2005  T:  06/02/2005  Job:  [id number]   cc:   Jarome Matin, M.D.  Fax: [phone]   Jonelle Sidle, M.D. [facility]  [address]  [place]  [place] [address]

## 2010-05-30 NOTE — H&P (Signed)
Samuel Wiley, Samuel Wiley                ACCOUNT NO.:  [id number]   MEDICAL RECORD NO.:  [id number]          PATIENT TYPE:  OIB   LOCATION:  2550                         FACILITY:  [facility]   PHYSICIAN:  Hermelinda Medicus, M.D.   DATE OF BIRTH:  25-Nov-1970   DATE OF ADMISSION:  05/13/2005  DATE OF DISCHARGE:                                HISTORY & PHYSICAL   This patient is a 40 year old male who has a weight of 119 kg. He is 70  inches tall.  He comes in with severe sleep apnea issues where he has a RDI  of 123 and his O2 nadir is 80%.  He is extremely sleep deprived with this  sleep apnea issue. He also has nasal obstruction.  He did have septal  surgery two years ago and turbinate reduction, but he still has considerable  nasal obstruction and associated sinusitis. 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  It also shows left ethmoid and bilateral maxillary sinus sinusitis with  persistent fluid levels. He now enters for a uvulopalatoplasty tonsillectomy  with a functional endoscopic sinus surgery, bilateral maxillary sinus ostial  enlargement, left ethmoidectomy, turbinate reduction and reduction of concha  bullosae under general endotracheal anesthesia, and he will be kept 23-hour  observation because of his history of sleep apnea.   ALLERGIES:  He has no allergies to medication.   He does do alcohol occasionally. Never smoked. His only surgery is the  turbinate reduction septoplasty two years ago. He has had five teeth  extracted. He does have hypertension, however, but does not see a  cardiologist and has never had a stress test. He has had asthma as a child  but never as an adult. He does have some hiatal hernia issues with some  reflux which I would think is closely related to his sleep apnea.   MEDICATIONS:  He does take medications which are listed as:  1.  Norvasc 10 mg q.d.  2.  Clonidine 0.1 mg p.o. twice a  day.  3.  He takes aspirin enteric; he has not taken it for one week; half a      tablet per day.  4.  Megavitamin.  5.  Ambien p.r.n. as necessary.   PHYSICAL EXAMINATION:  VITAL SIGNS:  His physical examination reveals a  blood pressure 171/91. His height is 70 inches.  He weighs 119.4 kg, and his  heart rate is 73.  HEENT:  His ears are clear. The tympanic membranes are clear and move well.  His nose:  The septum is straight, but he is very congested with very large  turbinates inferiorly and also with concha bullosae middle turbinates.  He  has a history of sinusitis.  He also has a left thyroid mass, and this is  going to be further evaluated after these initial problems are resolved.  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 His neck is otherwise  unremarkable.  His larynx, __________ cords, false cords, epiglottis and  base of  tongue are clear. True cord mobility, gag reflex, tongue mobility,  EOMs and facial nerve are all symmetrical, and his larynx is clear of any  ulceration or mass.  His oral cavity is clear, though it is very small and  narrow.  CHEST:  Clear. No rales, rhonchi or wheezes.  CARDIOVASCULAR:  No S1, S2.  No murmurs, rubs or gallops.  His EKG is  unremarkable.  ABDOMEN:  His abdomen is unremarkable.  EXTREMITIES:  Unremarkable.   INITIAL DIAGNOSIS:  Is that of sleep apnea with nasal obstruction, status  post septal reconstruction and turbinate reduction but with persistent nasal  obstruction with concha bullosae, associated sinusitis -maxillary bilateral  with ethmoid left,  associated with blood pressure elevation 171/91 and a  left thyroid mass which in the future will be evaluated. History of asthma  as a child. History of reflux esophagitis.   Our plan is for UPP, tonsillectomy, a concha bullosa reduction as well as  turbinate reduction and functional endoscopic sinus surgery of bilateral  maxillary sinus ostial enlargement with removal of  mucous and membrane and a  left ethmoidectomy.           ______________________________  Hermelinda Medicus, M.D.     JC/MEDQ  D:  05/13/2005  T:  05/13/2005  Job:  [id number]   cc:   Jarome Matin, M.D.  Fax: [phone]

## 2010-10-02 LAB — I-STAT 8, (EC8 V) (CONVERTED LAB)
Acid-Base Excess: 3 — ABNORMAL HIGH
BUN: 20
Bicarbonate: 29.9 — ABNORMAL HIGH
Chloride: 103
Glucose, Bld: 104 — ABNORMAL HIGH
HCT: 52
Hemoglobin: 17.7 — ABNORMAL HIGH
Operator id: 192351
Potassium: 4.1
Sodium: 137
TCO2: 32
pCO2, Ven: 54.3 — ABNORMAL HIGH
pH, Ven: 7.349 — ABNORMAL HIGH

## 2010-10-02 LAB — DIFFERENTIAL
Basophils Absolute: 0
Basophils Relative: 1
Eosinophils Absolute: 0.1
Eosinophils Relative: 1
Lymphocytes Relative: 27
Lymphs Abs: 1.9
Monocytes Absolute: 0.5
Monocytes Relative: 7
Neutro Abs: 4.6
Neutrophils Relative %: 65

## 2010-10-02 LAB — CBC
HCT: 47
Hemoglobin: 15.8
MCHC: 33.5
MCV: 80.5
Platelets: 291
RBC: 5.84 — ABNORMAL HIGH
RDW: 13
WBC: 7.1

## 2010-10-02 LAB — POCT I-STAT CREATININE
Creatinine, Ser: 1.4
Operator id: 192351

## 2010-10-17 LAB — DIFFERENTIAL
Basophils Absolute: 0
Basophils Relative: 0
Eosinophils Absolute: 0.1 — ABNORMAL LOW
Eosinophils Relative: 2
Lymphocytes Relative: 20
Lymphs Abs: 0.9
Monocytes Absolute: 0.4
Monocytes Relative: 9
Neutro Abs: 3.2
Neutrophils Relative %: 69

## 2010-10-17 LAB — CBC
HCT: 43.7
Hemoglobin: 14.7
MCHC: 33.6
MCV: 79.4
Platelets: 253
RBC: 5.5
RDW: 13.8
WBC: 4.6

## 2010-10-17 LAB — I-STAT 8, (EC8 V) (CONVERTED LAB)
Acid-Base Excess: 2
BUN: 18
Bicarbonate: 26.1 — ABNORMAL HIGH
Chloride: 103
Glucose, Bld: 116 — ABNORMAL HIGH
HCT: 50
Hemoglobin: 17
Operator id: 198171
Potassium: 3.7
Sodium: 135
TCO2: 27
pCO2, Ven: 36.8 — ABNORMAL LOW
pH, Ven: 7.459 — ABNORMAL HIGH

## 2010-10-17 LAB — POCT I-STAT CREATININE
Creatinine, Ser: 1.3
Operator id: 198171

## 2010-10-24 LAB — I-STAT 8, (EC8 V) (CONVERTED LAB)
Acid-Base Excess: 2
BUN: 17
Bicarbonate: 24.7 — ABNORMAL HIGH
Chloride: 103
Glucose, Bld: 107 — ABNORMAL HIGH
HCT: 48
Hemoglobin: 16.3
Operator id: 257131
Potassium: 4.2
Sodium: 135
TCO2: 26
pCO2, Ven: 33.9 — ABNORMAL LOW
pH, Ven: 7.47 — ABNORMAL HIGH

## 2011-03-20 ENCOUNTER — Encounter: Payer: Self-pay | Admitting: *Deleted

## 2011-03-26 ENCOUNTER — Ambulatory Visit (INDEPENDENT_AMBULATORY_CARE_PROVIDER_SITE_OTHER): Payer: 59 | Admitting: Cardiology

## 2011-03-26 ENCOUNTER — Encounter (HOSPITAL_COMMUNITY): Payer: Self-pay | Admitting: Cardiology

## 2011-03-26 ENCOUNTER — Encounter: Payer: Self-pay | Admitting: Cardiology

## 2011-03-26 VITALS — BP 142/80 | HR 57 | Ht 70.0 in | Wt 270.8 lb

## 2011-03-26 DIAGNOSIS — N529 Male erectile dysfunction, unspecified: Secondary | ICD-10-CM

## 2011-03-26 DIAGNOSIS — I10 Essential (primary) hypertension: Secondary | ICD-10-CM

## 2011-03-26 LAB — BASIC METABOLIC PANEL
BUN: 20 mg/dL (ref 6–23)
Calcium: 9.6 mg/dL (ref 8.4–10.5)
Creatinine, Ser: 1.3 mg/dL (ref 0.4–1.5)
GFR: 75.75 mL/min (ref >60.00)

## 2011-03-26 LAB — HEPATIC FUNCTION PANEL
ALT: 31 U/L (ref 0–53)
AST: 25 U/L (ref 0–37)
Alkaline Phosphatase: 54 U/L (ref 39–117)
Bilirubin, Direct: 0.1 mg/dL (ref 0.0–0.3)
Total Protein: 7.7 g/dL (ref 6.0–8.3)

## 2011-03-26 LAB — LIPID PANEL
Cholesterol: 185 mg/dL (ref 0–200)
VLDL: 11.6 mg/dL (ref 0.0–40.0)

## 2011-03-26 MED ORDER — SILDENAFIL CITRATE 50 MG PO TABS: 50.0000 mg | ORAL_TABLET | Freq: Every day | ORAL | Status: DC | PRN

## 2011-03-26 MED ORDER — IRBESARTAN-HYDROCHLOROTHIAZIDE 150-12.5 MG PO TABS: 1.0000 | ORAL_TABLET | Freq: Every day | ORAL | Status: DC

## 2011-03-26 MED ORDER — NEBIVOLOL HCL 5 MG PO TABS: 5.0000 mg | ORAL_TABLET | Freq: Every day | ORAL | Status: DC

## 2011-03-26 MED ORDER — AMLODIPINE BESYLATE 10 MG PO TABS: 10.0000 mg | ORAL_TABLET | Freq: Every day | ORAL | Status: DC

## 2011-03-26 NOTE — Assessment & Plan Note (Signed)
Resistant HTN, now seems controlled on 4 meds (amlodipine, irbesartan, HCTZ, nebivolol).  Continue current regimen.  Needs to lose some weight => gave him the target of 20 lb loss over the next year.  He will continue use of CPAP at night.

## 2011-03-26 NOTE — Patient Instructions (Signed)
Use Viagra as needed.  Your physician recommends that you have a FASTING lipid profile /liver profile/BMET/TSH  Your physician wants you to follow-up in: 1 year with Dr Shirlee Latch. (March 2014). You will receive a reminder letter in the mail two months in advance. If you don't receive a letter, please call our office to schedule the follow-up appointment.

## 2011-03-26 NOTE — Assessment & Plan Note (Signed)
Suspect this is due to the BP medications.  I will given him a prescription for Viagra 50 mg prn.   Will check lipids/LFTs/BMET/TSH today

## 2011-03-26 NOTE — Progress Notes (Signed)
PCP: Dr. Bascom Levels  41 yo with history of resistant hypertension with LVH and OSA presents for cardiology followup. BP today is 142/80.   He checks his BP fairly frequently, and it runs < 140/90 most of the time.  He has been doing well symptomatically. He is doing less aerobic exercise and more weight lifting now. No exertional dyspnea or shortness of breath. He is using his CPAP. No further palpitations. He has been having erectile difficulty and wonders if it could be related to the BP meds.   ECG: NSR, nonspecific T wave changes   Allergies (verified):  No Known Drug Allergies   Past Medical History:  1. Hypertension.  2. Palpitations. Event monitor showed PACs, PVCs, and did show a brief run of wide complex tachycardia, which was questionable whether this was aberrantly conducted supraventricular tachycardia versus a ventricular tachycardia. It was a very short run and management was not changed as result of this.  3. Obstructive sleep apnea status post uvuloplasty. The patient does use CPAP.  4. Perioperative troponin elevation in 2007 after the patient's uvuloplasty. He did have some chest pain at the time as well. Left heart catheterization at that time showed normal coronary arteries and normal renal arteries, EF was 50%.  5. Obesity.  6. Echocardiogram (3/12) with EF 50-55%, mild LV hypertrophy. 7. Erectile dysfunction: suspect related to BP meds.   Family History:  The patient reports extensive history of early onset hypertension in his family.   Social History:  The patient is nonsmoker, lives in [place], employed full-time as Public affairs consultant.   Current Outpatient Prescriptions  Medication Sig Dispense Refill  . amLODipine (NORVASC) 10 MG tablet Take 1 tablet (10 mg total) by mouth daily.  30 tablet  11  . arginine 500 MG tablet Take 500 mg by mouth 2 (two) times a week.       Marland Kitchen aspirin 81 MG tablet Take 81 mg by mouth daily.      . Citrulline 600 MG CAPS Take 1 capsule  by mouth 2 (two) times a week.       . irbesartan-hydrochlorothiazide (AVALIDE) 150-12.5 MG per tablet Take 1 tablet by mouth daily.  30 tablet  11  . nebivolol (BYSTOLIC) 5 MG tablet Take 1 tablet (5 mg total) by mouth daily.  30 tablet  11  . DISCONTD: amLODipine (NORVASC) 10 MG tablet Take 10 mg by mouth daily.      Marland Kitchen DISCONTD: irbesartan-hydrochlorothiazide (AVALIDE) 150-12.5 MG per tablet Take 1 tablet by mouth daily.      Marland Kitchen DISCONTD: nebivolol (BYSTOLIC) 5 MG tablet Take 5 mg by mouth daily.      . sildenafil (VIAGRA) 50 MG tablet Take 1 tablet (50 mg total) by mouth daily as needed for erectile dysfunction.  10 tablet  1    BP 142/80  Pulse 57  Ht 5\' 10"  (1.778 m)  Wt 270 lb 12.8 oz (122.834 kg)  BMI 38.86 kg/m2 General: NAD, overweight.  Neck: No JVD, fullness anterior neck, ? Enlarged thyroid.   Lungs: Clear to auscultation bilaterally with normal respiratory effort. CV: Nondisplaced PMI.  Heart regular S1/S2, soft S4, no murmur.  No peripheral edema.  No carotid bruit.  Normal pedal pulses.  Abdomen: Soft, nontender, no hepatosplenomegaly, no distention.   Neurologic: Alert and oriented x 3.  Psych: Normal affect. Extremities: No clubbing or cyanosis.

## 2011-03-31 NOTE — Progress Notes (Signed)
Addended by: Judithe Modest D on: 03/31/2011 02:47 PM   Modules accepted: Orders

## 2011-04-05 ENCOUNTER — Other Ambulatory Visit: Payer: Self-pay | Admitting: Cardiology

## 2011-04-23 ENCOUNTER — Other Ambulatory Visit: Payer: Self-pay | Admitting: Cardiology

## 2011-04-23 NOTE — Telephone Encounter (Signed)
Refilled amlodipine 

## 2011-04-24 ENCOUNTER — Telehealth: Payer: Self-pay | Admitting: Cardiology

## 2011-04-24 NOTE — Telephone Encounter (Signed)
Pt was in in [facility] , was to have rx refilled and called into [facility], as of today they are not there, needs viagra, amlodipine 10 mg, avalide 150-12.5 mg, and bystolic 5mg 

## 2011-04-27 ENCOUNTER — Other Ambulatory Visit: Payer: Self-pay | Admitting: *Deleted

## 2011-04-27 MED ORDER — SILDENAFIL CITRATE 50 MG PO TABS: 50.0000 mg | ORAL_TABLET | Freq: Every day | ORAL | Status: DC | PRN

## 2011-10-12 ENCOUNTER — Emergency Department (HOSPITAL_COMMUNITY)
Admission: EM | Admit: 2011-10-12 | Discharge: 2011-10-12 | Disposition: A | Discharge status: Home / Self Care | Payer: 59 | Attending: Emergency Medicine | Admitting: Emergency Medicine

## 2011-10-12 ENCOUNTER — Encounter (HOSPITAL_COMMUNITY): Payer: Self-pay | Admitting: *Deleted

## 2011-10-12 ENCOUNTER — Emergency Department (HOSPITAL_COMMUNITY): Payer: 59

## 2011-10-12 DIAGNOSIS — I059 Rheumatic mitral valve disease, unspecified: Secondary | ICD-10-CM | POA: Insufficient documentation

## 2011-10-12 DIAGNOSIS — I10 Essential (primary) hypertension: Secondary | ICD-10-CM | POA: Insufficient documentation

## 2011-10-12 DIAGNOSIS — G4733 Obstructive sleep apnea (adult) (pediatric): Secondary | ICD-10-CM | POA: Insufficient documentation

## 2011-10-12 DIAGNOSIS — R002 Palpitations: Secondary | ICD-10-CM | POA: Insufficient documentation

## 2011-10-12 LAB — CBC WITH DIFFERENTIAL/PLATELET
Basophils Absolute: 0 10*3/uL (ref 0.0–0.1)
Basophils Relative: 1 % (ref 0–1)
Hemoglobin: 14.4 g/dL (ref 13.0–17.0)
MCHC: 33.9 g/dL (ref 30.0–36.0)
Neutro Abs: 3.4 10*3/uL (ref 1.7–7.7)
Neutrophils Relative %: 54 % (ref 43–77)
RDW: 13.5 % (ref 11.5–15.5)

## 2011-10-12 LAB — POCT I-STAT TROPONIN I: Troponin i, poc: 0.02 ng/mL (ref 0.00–0.08)

## 2011-10-12 LAB — BASIC METABOLIC PANEL
Chloride: 98 mEq/L (ref 96–112)
GFR calc Af Amer: 86 mL/min — ABNORMAL LOW (ref >90)
Potassium: 3.8 mEq/L (ref 3.5–5.1)

## 2011-10-12 NOTE — ED Provider Notes (Signed)
10:14 PM Results for orders placed during the hospital encounter of 10/12/11  CBC WITH DIFFERENTIAL      Component Value Range   WBC 6.3  4.0 - 10.5 K/uL   RBC 5.24  4.22 - 5.81 MIL/uL   Hemoglobin 14.4  13.0 - 17.0 g/dL   HCT 09.8  11.9 - 14.7 %   MCV 81.1  78.0 - 100.0 fL   MCH 27.5  26.0 - 34.0 pg   MCHC 33.9  30.0 - 36.0 g/dL   RDW 82.9  56.2 - 13.0 %   Platelets 257  150 - 400 K/uL   Neutrophils Relative 54  43 - 77 %   Neutro Abs 3.4  1.7 - 7.7 K/uL   Lymphocytes Relative 33  12 - 46 %   Lymphs Abs 2.1  0.7 - 4.0 K/uL   Monocytes Relative 9  3 - 12 %   Monocytes Absolute 0.6  0.1 - 1.0 K/uL   Eosinophils Relative 4  0 - 5 %   Eosinophils Absolute 0.2  0.0 - 0.7 K/uL   Basophils Relative 1  0 - 1 %   Basophils Absolute 0.0  0.0 - 0.1 K/uL  BASIC METABOLIC PANEL      Component Value Range   Sodium 135  135 - 145 mEq/L   Potassium 3.8  3.5 - 5.1 mEq/L   Chloride 98  96 - 112 mEq/L   CO2 27  19 - 32 mEq/L   Glucose, Bld 124 (*) 70 - 99 mg/dL   BUN 18  6 - 23 mg/dL   Creatinine, Ser 8.65  0.50 - 1.35 mg/dL   Calcium 9.6  8.4 - 78.4 mg/dL   GFR calc non Af Amer 74 (*) >90 mL/min   GFR calc Af Amer 86 (*) >90 mL/min  POCT I-STAT TROPONIN I      Component Value Range   Troponin i, poc 0.02  0.00 - 0.08 ng/mL   Comment 3            Dg Chest 2 View  10/12/2011  *RADIOLOGY REPORT*  Clinical Data: Hypertension and palpitations.  CHEST - 2 VIEW  Comparison: 12/29/2006.  Findings: The cardiac silhouette, mediastinal and hilar contours are normal and stable.  The lungs are clear.  No pleural effusion. The bony thorax is intact.  IMPRESSION: No acute cardiopulmonary findings.   Original Report Authenticated By: P. Loralie Champagne, M.D.      Date: 10/12/2011  Rate: 81  Rhythm: normal sinus rhythm  QRS Axis: normal  Intervals: normal  ST/T Wave abnormalities: nonspecific T wave changes  Conduction Disutrbances:none  Narrative Interpretation: Abnormal EKG   Old EKG Reviewed:  changes noted--PAC's were present on tracing on 12/29/2006.  I reviewed pt's lab results with him.  He had a brief episode of palpitations.  Advised to avoid caffeinated beverages.    Carleene Cooper III, MD 10/12/11 2227

## 2011-10-12 NOTE — ED Provider Notes (Signed)
History     CSN: [id number]  Arrival date & time 10/12/11  1543   First MD Initiated Contact with Patient 10/12/11 2055      Chief Complaint  Patient presents with  . Palpitations  . Numbness    (Consider location/radiation/quality/duration/timing/severity/associated sxs/prior treatment) HPI Comments: Patient is a 41 year old male with a past medical history of hypertension who presents with an episode of heart palpitations and left chest pain at midaxillary area today while driving. The pain did not radiate and describes as dull. He reports sudden onset with associated SOB and left arm tingling that lasted about 20 minutes and spontaneously resolved. He denies aggravating/alleviating factors. He reports current residual chest tightness currently. He denies NVD, abdominal pain, diaphoresis. Patient reports elevated troponin after surgery in 2007 but cardiac cath following showed no blockages.   Patient is a 41 y.o. male presenting with palpitations.  Palpitations  Associated symptoms include numbness and shortness of breath.    Past Medical History  Diagnosis Date  . Hypertension   . Heart palpitations   . Obstructive sleep apnea   . Perioperative myocardial infarction     Perioperative troponin elevation in 2007 after the patient's uvuloplasty.  He did have some chest pain at the time as well.  Left heart catheterization at that time showed normal coronary arteries and normal renal  arteries, EF was 50%.  . Obesity   . History of echocardiogram     (5/09) showed EF 50-55%, mild LV dilation, mild LV hypertrophy, pseudonormal  diastolic function, mild mitral regurgitation, and mild left atrial enlargement.  . Mild mitral regurgitation by prior echocardiogram     Past Surgical History  Procedure Date  . Cardiac catheterization     EF of 50% with Normal coronary arteries, normal renal arteries    Family History  Problem Relation Age of Onset  . Hypertension      family  history of early onset    History  Substance Use Topics  . Smoking status: Never Smoker   . Smokeless tobacco: Not on file  . Alcohol Use: Not on file      Review of Systems  Respiratory: Positive for shortness of breath.   Cardiovascular: Positive for palpitations.  Neurological: Positive for numbness.  All other systems reviewed and are negative.    Allergies  Review of patient's allergies indicates no known allergies.  Home Medications   Current Outpatient Rx  Name Route Sig Dispense Refill  . AMLODIPINE BESYLATE 10 MG PO TABS  TAKE 1 TABLET BY MOUTH EVERY DAY 30 tablet 11  . ARGININE 500 MG PO TABS Oral Take 500 mg by mouth 2 (two) times a week.     . ASPIRIN 81 MG PO TABS Oral Take 81 mg by mouth daily.    Marland Kitchen CITRULLINE 600 MG PO CAPS Oral Take 1 capsule by mouth 2 (two) times a week.     . IRBESARTAN-HYDROCHLOROTHIAZIDE 150-12.5 MG PO TABS Oral Take 1 tablet by mouth daily. 30 tablet 11  . NEBIVOLOL HCL 5 MG PO TABS Oral Take 1 tablet (5 mg total) by mouth daily. 30 tablet 11  . SILDENAFIL CITRATE 50 MG PO TABS Oral Take 1 tablet (50 mg total) by mouth daily as needed for erectile dysfunction. 10 tablet 1  . SILDENAFIL CITRATE 50 MG PO TABS Oral Take 1 tablet (50 mg total) by mouth daily as needed for erectile dysfunction. 10 tablet 1    BP 150/75  Pulse 81  Temp 98.2 F (36.8 C) (Oral)  Resp 18  SpO2 100%  Physical Exam  Nursing note and vitals reviewed. Constitutional: He is oriented to person, place, and time. He appears well-developed and well-nourished. No distress.  HENT:  Head: Normocephalic and atraumatic.  Mouth/Throat: No oropharyngeal exudate.       Absent uvula.   Eyes: Conjunctivae normal and EOM are normal. Pupils are equal, round, and reactive to light. No scleral icterus.  Neck: Normal range of motion. Neck supple.  Cardiovascular: Normal rate and regular rhythm.  Exam reveals no gallop and no friction rub.   No murmur  heard. Pulmonary/Chest: Effort normal and breath sounds normal. No respiratory distress. He has no wheezes. He has no rales. He exhibits no tenderness.  Abdominal: Soft. He exhibits no distension. There is no tenderness. There is no rebound.  Musculoskeletal: Normal range of motion.  Neurological: He is alert and oriented to person, place, and time. Coordination normal.  Skin: Skin is warm and dry. He is not diaphoretic.  Psychiatric: He has a normal mood and affect. His behavior is normal.    ED Course  Procedures (including critical care time)  Labs Reviewed  BASIC METABOLIC PANEL - Abnormal; Notable for the following:    Glucose, Bld 124 (*)     GFR calc non Af Amer 74 (*)     GFR calc Af Amer 86 (*)     All other components within normal limits  CBC WITH DIFFERENTIAL  POCT I-STAT TROPONIN I   Dg Chest 2 View  10/12/2011  *RADIOLOGY REPORT*  Clinical Data: Hypertension and palpitations.  CHEST - 2 VIEW  Comparison: 12/29/2006.  Findings: The cardiac silhouette, mediastinal and hilar contours are normal and stable.  The lungs are clear.  No pleural effusion. The bony thorax is intact.  IMPRESSION: No acute cardiopulmonary findings.   Original Report Authenticated By: P. Loralie Champagne, M.D.      1. Palpitations       MDM  9:17 PM Patient's labs and initial troponin unremarkable. EKG unremarkable. Patient not currently having chest pain. Symptoms have resolved.   10:02 PM Dr. Ignacia Palma saw the patient and says the patient is OK to go home.       Emilia Beck, [place] 10/12/11 2233

## 2011-10-12 NOTE — ED Notes (Addendum)
Pt reports approx 1 hour ago started having a feeling like his heart was racing with mild sob, denies palpitations at this time. States he had numbness/tingling radiate to left arm. Pt reports mild headache. GCS 15. Grip strength equal. Speech clear. ambulatory without difficulty. Triage delayed due to pt needing to use restroom first. Denies any chest pain at this time, reports did have a pain to left axillary earlier.

## 2011-10-12 NOTE — Progress Notes (Signed)
41 yo man had brief episode of left lateral chest pain and palpitations. Exam and lab workup is normal.  Reassured and released.  Advised to avoid caffeinated beverages.

## 2011-10-13 NOTE — ED Provider Notes (Signed)
Medical screening examination/treatment/procedure(s) were conducted as a shared visit with non-physician practitioner(s) and myself.  I personally evaluated the patient during the encounter 10:14 PM Results for orders placed during the hospital encounter of 10/12/11  CBC WITH DIFFERENTIAL      Component Value Range   WBC 6.3  4.0 - 10.5 K/uL   RBC 5.24  4.22 - 5.81 MIL/uL   Hemoglobin 14.4  13.0 - 17.0 g/dL   HCT 21.3  08.6 - 57.8 %   MCV 81.1  78.0 - 100.0 fL   MCH 27.5  26.0 - 34.0 pg   MCHC 33.9  30.0 - 36.0 g/dL   RDW 46.9  62.9 - 52.8 %   Platelets 257  150 - 400 K/uL   Neutrophils Relative 54  43 - 77 %   Neutro Abs 3.4  1.7 - 7.7 K/uL   Lymphocytes Relative 33  12 - 46 %   Lymphs Abs 2.1  0.7 - 4.0 K/uL   Monocytes Relative 9  3 - 12 %   Monocytes Absolute 0.6  0.1 - 1.0 K/uL   Eosinophils Relative 4  0 - 5 %   Eosinophils Absolute 0.2  0.0 - 0.7 K/uL   Basophils Relative 1  0 - 1 %   Basophils Absolute 0.0  0.0 - 0.1 K/uL  BASIC METABOLIC PANEL      Component Value Range   Sodium 135  135 - 145 mEq/L   Potassium 3.8  3.5 - 5.1 mEq/L   Chloride 98  96 - 112 mEq/L   CO2 27  19 - 32 mEq/L   Glucose, Bld 124 (*) 70 - 99 mg/dL   BUN 18  6 - 23 mg/dL   Creatinine, Ser 4.13  0.50 - 1.35 mg/dL   Calcium 9.6  8.4 - 24.4 mg/dL   GFR calc non Af Amer 74 (*) >90 mL/min   GFR calc Af Amer 86 (*) >90 mL/min  POCT I-STAT TROPONIN I      Component Value Range   Troponin i, poc 0.02  0.00 - 0.08 ng/mL   Comment 3            Dg Chest 2 View  10/12/2011  *RADIOLOGY REPORT*  Clinical Data: Hypertension and palpitations.  CHEST - 2 VIEW  Comparison: 12/29/2006.  Findings: The cardiac silhouette, mediastinal and hilar contours are normal and stable.  The lungs are clear.  No pleural effusion. The bony thorax is intact.  IMPRESSION: No acute cardiopulmonary findings.   Original Report Authenticated By: P. Loralie Champagne, M.D.      Date: 10/12/2011  Rate: 81  Rhythm: normal sinus  rhythm  QRS Axis: normal  Intervals: normal  ST/T Wave abnormalities: nonspecific T wave changes  Conduction Disutrbances:none  Narrative Interpretation: Abnormal EKG   Old EKG Reviewed: changes noted--PAC's were present on tracing on 12/29/2006.  I reviewed pt's lab results with him.  He had a brief episode of palpitations.  Advised to avoid caffeinated beverages.    Carleene Cooper III, MD 10/13/11 [phone]

## 2012-03-31 ENCOUNTER — Other Ambulatory Visit: Payer: Self-pay | Admitting: Cardiology

## 2012-03-31 DIAGNOSIS — I10 Essential (primary) hypertension: Secondary | ICD-10-CM

## 2012-03-31 MED ORDER — IRBESARTAN-HYDROCHLOROTHIAZIDE 150-12.5 MG PO TABS: 1.0000 | ORAL_TABLET | Freq: Every day | ORAL | Status: DC

## 2012-03-31 MED ORDER — AMLODIPINE BESYLATE 10 MG PO TABS: ORAL_TABLET | ORAL | Status: DC

## 2012-04-21 ENCOUNTER — Telehealth: Payer: Self-pay | Admitting: *Deleted

## 2012-04-21 DIAGNOSIS — I10 Essential (primary) hypertension: Secondary | ICD-10-CM

## 2012-04-21 MED ORDER — IRBESARTAN-HYDROCHLOROTHIAZIDE 150-12.5 MG PO TABS: 1.0000 | ORAL_TABLET | Freq: Every day | ORAL | Status: DC

## 2012-04-21 MED ORDER — NEBIVOLOL HCL 5 MG PO TABS: ORAL_TABLET | ORAL | Status: DC

## 2012-04-21 MED ORDER — AMLODIPINE BESYLATE 10 MG PO TABS: ORAL_TABLET | ORAL | Status: DC

## 2012-04-21 NOTE — Telephone Encounter (Signed)
Patient calling refill line to have 3 medications refilled.  Amlo 10mg  Avalide 150-12.5 Bystolic 5 #30, 2RF, patient was told to schedule follow up appointment   Manjot Hinks, cma

## 2012-08-08 ENCOUNTER — Ambulatory Visit (INDEPENDENT_AMBULATORY_CARE_PROVIDER_SITE_OTHER): Payer: 59 | Admitting: Physician Assistant

## 2012-08-08 ENCOUNTER — Encounter: Payer: Self-pay | Admitting: Physician Assistant

## 2012-08-08 VITALS — BP 120/88 | HR 51 | Ht 70.5 in | Wt 251.0 lb

## 2012-08-08 DIAGNOSIS — R9431 Abnormal electrocardiogram [ECG] [EKG]: Secondary | ICD-10-CM

## 2012-08-08 DIAGNOSIS — I10 Essential (primary) hypertension: Secondary | ICD-10-CM

## 2012-08-08 LAB — HEPATIC FUNCTION PANEL
ALT: 20 U/L (ref 0–53)
AST: 15 U/L (ref 0–37)
Bilirubin, Direct: 0.1 mg/dL (ref 0.0–0.3)
Total Protein: 7.6 g/dL (ref 6.0–8.3)

## 2012-08-08 LAB — BASIC METABOLIC PANEL
BUN: 21 mg/dL (ref 6–23)
GFR: 81.59 mL/min (ref >60.00)
Potassium: 4.1 mEq/L (ref 3.5–5.1)
Sodium: 136 mEq/L (ref 135–145)

## 2012-08-08 LAB — LIPID PANEL
Cholesterol: 164 mg/dL (ref 0–200)
HDL: 46.1 mg/dL (ref >39.00)
VLDL: 5.4 mg/dL (ref 0.0–40.0)

## 2012-08-08 MED ORDER — IRBESARTAN-HYDROCHLOROTHIAZIDE 150-12.5 MG PO TABS: 1.0000 | ORAL_TABLET | Freq: Every day | ORAL | Status: DC

## 2012-08-08 MED ORDER — SILDENAFIL CITRATE 50 MG PO TABS: 50.0000 mg | ORAL_TABLET | Freq: Every day | ORAL | Status: DC | PRN

## 2012-08-08 MED ORDER — NEBIVOLOL HCL 5 MG PO TABS: ORAL_TABLET | ORAL | Status: DC

## 2012-08-08 MED ORDER — AMLODIPINE BESYLATE 10 MG PO TABS: ORAL_TABLET | ORAL | Status: DC

## 2012-08-08 NOTE — Patient Instructions (Addendum)
PLEASE SCHEDULE FOR ECHO W/O CONTRAST  LABS TODAY; BMET, FLP, LFT  FOLLOW UP WITH DR. Shirlee Latch IN 6 MONTHS  REFILLS WERE SENT IN FOR YOUR AMLODIPINE, AVALIDE, BYSTOLIC

## 2012-08-08 NOTE — Progress Notes (Signed)
[address] [place], [place]  [address] Phone: [phone] Fax:  [phone]  Date:  08/08/2012   ID:  Samuel Wiley, Samuel Wiley 27-Aug-1970, MRN [id number]  PCP:  Jeri Cos, MD  Cardiologist:  Dr. Marca Ancona     History of Present Illness: Samuel Wiley is a 42 y.o. male who returns for follow up.  He has a history of resistant hypertension with LVH and OSA.  Last seen by Dr. Marca Ancona in 03/2011.  He denies exertional chest pain or shortness of breath. He denies syncope. He denies orthopnea, PND or edema. He is compliant with CPAP.  Labs (3/13):  K 4.3, Cr 1.3, ALT 31, LDL 127, TSH 0.56,  Labs (0/34):  K 3.8, Cr 1.19, Hgb 14.4,   Wt Readings from Last 3 Encounters:  08/08/12 251 lb (113.853 kg)  03/26/11 270 lb 12.8 oz (122.834 kg)  03/27/10 264 lb (119.75 kg)    Past Medical History:  1. Hypertension.  2. Palpitations. Event monitor showed PACs, PVCs, and did show a brief run of wide complex tachycardia, which was questionable whether this was aberrantly conducted supraventricular tachycardia versus a ventricular tachycardia. It was a very short run and management was not changed as result of this.  3. Obstructive sleep apnea status post uvuloplasty. The patient does use CPAP.  4. Perioperative troponin elevation in 2007 after the patient's uvuloplasty. He did have some chest pain at the time as well. Left heart catheterization at that time showed normal coronary arteries and normal renal arteries, EF was 50%.  5. Obesity.  6. Echocardiogram (3/12) with EF 50-55%, mild LV hypertrophy.  7. Erectile dysfunction: suspect related to BP meds.     Current Outpatient Prescriptions  Medication Sig Dispense Refill  . amLODipine (NORVASC) 10 MG tablet TAKE 1 TABLET BY MOUTH EVERY DAY  30 tablet  2  . arginine 500 MG tablet Take 500 mg by mouth 2 (two) times a week.       Marland Kitchen aspirin 81 MG tablet Take 81 mg by mouth daily.      . Citrulline 600 MG CAPS  Take 1 capsule by mouth 2 (two) times a week.       . irbesartan-hydrochlorothiazide (AVALIDE) 150-12.5 MG per tablet Take 1 tablet by mouth daily.  30 tablet  2  . nebivolol (BYSTOLIC) 5 MG tablet TAKE 1 TABLET (5 MG TOTAL) BY MOUTH DAILY.  30 tablet  2  . sildenafil (VIAGRA) 50 MG tablet Take 1 tablet (50 mg total) by mouth daily as needed for erectile dysfunction.  10 tablet  1   No current facility-administered medications for this visit.    Allergies:   No Known Allergies  Social History:  The patient  reports that he has never smoked. He does not have any smokeless tobacco history on file.   ROS:  Please see the history of present illness.   He has occasional left hand numbness.   All other systems reviewed and negative.   PHYSICAL EXAM: VS:  BP 120/88  Pulse 51  Ht 5' 10.5" (1.791 m)  Wt 251 lb (113.853 kg)  BMI 35.49 kg/m2 Well nourished, well developed, in no acute distress HEENT: normal Neck: no JVD Cardiac:  normal S1, S2; RRR; no murmur Lungs:  clear to auscultation bilaterally, no wheezing, rhonchi or rales Abd: soft, nontender, no hepatomegaly Ext: no edema Skin: warm and dry Neuro:  CNs 2-12 intact, no focal abnormalities noted  EKG:  Sinus  bradycardia, HR 51, borderline LVH with inferolateral T wave inversions     ASSESSMENT AND PLAN:  1. Hypertension: Fair control. Continue current therapy. If blood pressure increases, consider changing Avalide to 300/12.5 mg daily.  He does quite a bit of traveling. The HCTZ does cause him to urinate quite a bit. He requests that when he travels, he separate the Avalide into Irbesartan and an HCTZ so that he can change the timing of the diuretic dose. He can when he wants to do that.  Check a basic metabolic panel today. Check follow up lipids and LFTs. 2. Abnormal ECG:  Infero-lateral T-wave inversions are not documented on prior ECGs.  Question if this is LVH with repolarization abnormality. He exercises on a daily basis. He has  no exertional chest pain. I will obtain an echocardiogram to assess for significant LVH or LV dysfunction. 3. Disposition: Follow up with Dr. Shirlee Latch in 6 months.  Signed, Tereso Newcomer, PA-C  08/08/2012 11:49 AM

## 2012-08-12 ENCOUNTER — Telehealth: Payer: Self-pay | Admitting: Cardiology

## 2012-08-12 NOTE — Telephone Encounter (Signed)
Follow Up ° ° ° ° °Pt calling following up on test results. Please call. °

## 2012-08-12 NOTE — Telephone Encounter (Signed)
Pt aware of lab results and PA's recommendations.

## 2012-08-22 ENCOUNTER — Ambulatory Visit (HOSPITAL_COMMUNITY): Payer: 59 | Attending: Internal Medicine | Admitting: Radiology

## 2012-08-22 DIAGNOSIS — E669 Obesity, unspecified: Secondary | ICD-10-CM | POA: Insufficient documentation

## 2012-08-22 DIAGNOSIS — I491 Atrial premature depolarization: Secondary | ICD-10-CM | POA: Insufficient documentation

## 2012-08-22 DIAGNOSIS — R9431 Abnormal electrocardiogram [ECG] [EKG]: Secondary | ICD-10-CM

## 2012-08-22 DIAGNOSIS — G4733 Obstructive sleep apnea (adult) (pediatric): Secondary | ICD-10-CM | POA: Insufficient documentation

## 2012-08-22 DIAGNOSIS — R079 Chest pain, unspecified: Secondary | ICD-10-CM | POA: Insufficient documentation

## 2012-08-22 DIAGNOSIS — R002 Palpitations: Secondary | ICD-10-CM | POA: Insufficient documentation

## 2012-08-22 DIAGNOSIS — I10 Essential (primary) hypertension: Secondary | ICD-10-CM | POA: Insufficient documentation

## 2012-08-22 NOTE — Progress Notes (Signed)
Echocardiogram performed.  

## 2012-08-23 ENCOUNTER — Telehealth: Payer: Self-pay | Admitting: *Deleted

## 2012-08-23 NOTE — Telephone Encounter (Signed)
Follow Up     Pt calling returning call regarding test results. Please call back.

## 2012-08-23 NOTE — Telephone Encounter (Signed)
pt notified about echo results with verbal understanding today 

## 2012-08-23 NOTE — Telephone Encounter (Signed)
lmptcb on the home # go over echo results, no answer on the cell#.

## 2012-08-27 ENCOUNTER — Encounter: Payer: Self-pay | Admitting: Physician Assistant

## 2012-11-17 ENCOUNTER — Other Ambulatory Visit: Payer: Self-pay

## 2013-01-06 ENCOUNTER — Encounter: Payer: Self-pay | Admitting: Nephrology

## 2013-01-19 ENCOUNTER — Encounter: Payer: Self-pay | Admitting: Family Medicine

## 2013-01-19 ENCOUNTER — Ambulatory Visit (INDEPENDENT_AMBULATORY_CARE_PROVIDER_SITE_OTHER): Payer: 59 | Admitting: Family Medicine

## 2013-01-19 VITALS — BP 108/76 | Temp 98.9°F | Ht 69.5 in | Wt 261.0 lb

## 2013-01-19 DIAGNOSIS — E785 Hyperlipidemia, unspecified: Secondary | ICD-10-CM

## 2013-01-19 DIAGNOSIS — R079 Chest pain, unspecified: Secondary | ICD-10-CM

## 2013-01-19 DIAGNOSIS — I10 Essential (primary) hypertension: Secondary | ICD-10-CM

## 2013-01-19 DIAGNOSIS — Z23 Encounter for immunization: Secondary | ICD-10-CM

## 2013-01-19 LAB — LIPID PANEL
CHOL/HDL RATIO: 5
CHOLESTEROL: 208 mg/dL — AB (ref 0–200)
HDL: 44.2 mg/dL (ref >39.00)
Triglycerides: 32 mg/dL (ref 0.0–149.0)
VLDL: 6.4 mg/dL (ref 0.0–40.0)

## 2013-01-19 LAB — BASIC METABOLIC PANEL
BUN: 29 mg/dL — AB (ref 6–23)
CALCIUM: 9.5 mg/dL (ref 8.4–10.5)
CO2: 28 mEq/L (ref 19–32)
Chloride: 100 mEq/L (ref 96–112)
Creatinine, Ser: 1.3 mg/dL (ref 0.4–1.5)
GFR: 75.1 mL/min (ref >60.00)
GLUCOSE: 89 mg/dL (ref 70–99)
Potassium: 4 mEq/L (ref 3.5–5.1)
SODIUM: 137 meq/L (ref 135–145)

## 2013-01-19 LAB — LDL CHOLESTEROL, DIRECT: LDL DIRECT: 149.1 mg/dL

## 2013-01-19 LAB — HEMOGLOBIN A1C: Hgb A1c MFr Bld: 6.2 % (ref 4.6–6.5)

## 2013-01-19 NOTE — Progress Notes (Addendum)
Chief Complaint  Patient presents with  . Establish Care    HPI:  Samuel Wiley is here to establish care. Hasn't had PCP in a number of years. Last PCP and physical:  Has the following chronic problems and concerns today:  Patient Active Problem List   Diagnosis Date Noted  . Erectile dysfunction 03/26/2011  . OBESITY 03/26/2010  . HYPERTENSION 03/26/2010  . PREMATURE ATRIAL CONTRACTIONS 03/26/2010  . SLEEP APNEA 03/26/2010  . PALPITATIONS 03/26/2010  . CHEST PAIN 03/26/2010   Sees cardiology for resistant hypertension, LVH, CP, PACs and OSA. Stable. No fevers, palpitations, presyncope, swelling.  Health Maintenance: tdap - he wants to get this today  ROS: See pertinent positives and negatives per HPI.  Past Medical History  Diagnosis Date  . Hypertension     high blood pressure readings   . Heart palpitations   . Obstructive sleep apnea   . Obesity   . History of echocardiogram     (5/09) showed EF 50-55%, mild LV dilation, mild LV hypertrophy, pseudonormal  diastolic function, mild mitral regurgitation, and mild left atrial enlargement.;  Echo 8/14:  Mild LVH, EF 55-60%, Gr 2 DD, mild MR, mild LAE  . Mild mitral regurgitation by prior echocardiogram   . Chicken pox   . GERD (gastroesophageal reflux disease)   . Asthma     childhood  . Perioperative myocardial infarction     Perioperative troponin elevation in 2007 after the patient's uvuloplasty.  He did have some chest pain at the time as well.  Left heart catheterization at that time showed normal coronary arteries and normal renal  arteries, EF was 50%.    Family History  Problem Relation Age of Onset  . Hypertension Father     family history of early onset  . Hypertension Mother   . Diabetes Mother     History   Social History  . Marital Status: Single    Spouse Name: N/A    Number of Children: N/A  . Years of Education: N/A   Social History Main Topics  . Smoking status: Never Smoker   .  Smokeless tobacco: None  . Alcohol Use: Yes     Comment: few drinks occ  . Drug Use: None  . Sexual Activity: None   Other Topics Concern  . 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      Spiritual Beliefs: none      Lifestyle: intermittent weight lifting and gym; diet is bad             Current outpatient prescriptions:amLODipine (NORVASC) 10 MG tablet, TAKE 1 TABLET BY MOUTH EVERY DAY, Disp: 30 tablet, Rfl: 11;  aspirin 81 MG tablet, Take 81 mg by mouth daily., Disp: , Rfl: ;  irbesartan-hydrochlorothiazide (AVALIDE) 150-12.5 MG per tablet, Take 1 tablet by mouth daily., Disp: 30 tablet, Rfl: 11;  nebivolol (BYSTOLIC) 5 MG tablet, TAKE 1 TABLET (5 MG TOTAL) BY MOUTH DAILY., Disp: 30 tablet, Rfl: 11 sildenafil (VIAGRA) 50 MG tablet, Take 1 tablet (50 mg total) by mouth daily as needed for erectile dysfunction., Disp: 10 tablet, Rfl: 1;  Citrulline 600 MG CAPS, Take 1,000 capsules by mouth 2 (two) times a week. , Disp: , Rfl:   EXAM:  Filed Vitals:   01/19/13 1106  BP: 108/76  Temp: 98.9 F (37.2 C)    Body mass index is 38 kg/(m^2).  GENERAL: vitals  reviewed and listed above, alert, oriented, appears well hydrated and in no acute distress  HEENT: atraumatic, conjunttiva clear, no obvious abnormalities on inspection of external nose and ears  NECK: no obvious masses on inspection  LUNGS: clear to auscultation bilaterally, no wheezes, rales or rhonchi, good air movement  CV: HRRR, no peripheral edema  MS: moves all extremities without noticeable abnormality  PSYCH: pleasant and cooperative, no obvious depression or anxiety  ASSESSMENT AND PLAN:  Discussed the following assessment and plan:  HYPERTENSION - Plan: Basic metabolic panel  Hyperlipemia - Plan: Lipid Panel, Hemoglobin A1c  CHEST PAIN  -We reviewed the PMH, PSH, FH, SH, Meds and Allergies. -We provided refills for any medications we  will prescribe as needed. -We addressed current concerns per orders and patient instructions. -We have asked for records for pertinent exams, studies, vaccines and notes from previous providers. -We have advised patient to follow up per instructions below. -NON-FASTING labs -tdap today  -Patient advised to return or notify a doctor immediately if symptoms worsen or persist or new concerns arise.  Patient Instructions  -We have ordered labs or studies at this visit. It can take up to 1-2 weeks for results and processing. We will contact you with instructions IF your results are abnormal. Normal results will be released to your [facility]. If you have not heard from [place] or can not find your results in [facility] in 2 weeks please contact our office.  -PLEASE SIGN UP FOR MYCHART TODAY   We recommend the following healthy lifestyle measures: - eat a healthy diet consisting of lots of vegetables, fruits, beans, nuts, seeds, healthy meats such as white chicken and fish and whole grains.  - avoid fried foods, fast food, processed foods, sodas, red meet and other fattening foods.  - get a least 150 minutes of aerobic exercise per week.   Follow up in: 4-6 months      KIM, HANNAH R.

## 2013-01-19 NOTE — Addendum Note (Signed)
Addended by: Colleen Can on: 01/19/2013 03:03 PM   Modules accepted: Orders

## 2013-01-19 NOTE — Progress Notes (Signed)
Pre visit review using our clinic review tool, if applicable. No additional management support is needed unless otherwise documented below in the visit note. 

## 2013-01-19 NOTE — Patient Instructions (Signed)
-  We have ordered labs or studies at this visit. It can take up to 1-2 weeks for results and processing. We will contact you with instructions IF your results are abnormal. Normal results will be released to your [facility]. If you have not heard from [place] or can not find your results in [facility] in 2 weeks please contact our office.  -PLEASE SIGN UP FOR MYCHART TODAY   We recommend the following healthy lifestyle measures: - eat a healthy diet consisting of lots of vegetables, fruits, beans, nuts, seeds, healthy meats such as white chicken and fish and whole grains.  - avoid fried foods, fast food, processed foods, sodas, red meet and other fattening foods.  - get a least 150 minutes of aerobic exercise per week.   Follow up in: 4-6 months

## 2013-04-06 ENCOUNTER — Encounter: Payer: Self-pay | Admitting: Family Medicine

## 2013-04-06 NOTE — Progress Notes (Signed)
Received office noes from [facility] from visit on 3.23.15.  Pt seen for lower urinary tract symptoms, prostate screening, pelvic lipomatosis and ED. Pt given trial of Viagra and.  Keep on current dose of terazosin.  Annual screening prostate.  RTC for 1 year with PSA or sooner if issues arise.

## 2013-07-19 ENCOUNTER — Encounter: Payer: Self-pay | Admitting: Family Medicine

## 2013-07-19 ENCOUNTER — Ambulatory Visit (INDEPENDENT_AMBULATORY_CARE_PROVIDER_SITE_OTHER): Payer: 59 | Admitting: Family Medicine

## 2013-07-19 VITALS — BP 124/80 | HR 51 | Temp 98.2°F | Ht 69.5 in | Wt 267.5 lb

## 2013-07-19 DIAGNOSIS — I10 Essential (primary) hypertension: Secondary | ICD-10-CM

## 2013-07-19 DIAGNOSIS — R002 Palpitations: Secondary | ICD-10-CM

## 2013-07-19 DIAGNOSIS — E669 Obesity, unspecified: Secondary | ICD-10-CM

## 2013-07-19 DIAGNOSIS — I491 Atrial premature depolarization: Secondary | ICD-10-CM

## 2013-07-19 DIAGNOSIS — R079 Chest pain, unspecified: Secondary | ICD-10-CM

## 2013-07-19 NOTE — Progress Notes (Signed)
No chief complaint on file.   HPI:  Follow up:  Prediabetes/Obesity: -advised lifestyle changes -reports: no regular exercise, no dietary changes -denies: vision changes, polyuria, polydipsia, wounds  Hyperlipidemia: -advised sig lifestyle changes and close follow up to recheck and start statin if needed - he did not follow this recommendation and returns > 6 months later -reports: he has not done anything and refuses to check today -denies: skin changes  Resistant HTN/LVH, OSA: -meds:norvasc 10, asa, irbesartan-hctz(avalide) 150-12.5, nebivolol -managed by his cardiologist -wears cpap - thinks getting old and thinks needs new machine, [facility] - does not want to get equipment from there any more as they were cheating his insurance -Settings are 18 mmg Hg -denies: CP, sob, swelling, syncope  ED: -followed by [facility] -denies: priapism, LUTS worsening  ROS: See pertinent positives and negatives per HPI.  Past Medical History  Diagnosis Date  . Hypertension     high blood pressure readings   . Heart palpitations   . Obstructive sleep apnea   . Obesity   . History of echocardiogram     (5/09) showed EF 50-55%, mild LV dilation, mild LV hypertrophy, pseudonormal  diastolic function, mild mitral regurgitation, and mild left atrial enlargement.;  Echo 8/14:  Mild LVH, EF 55-60%, Gr 2 DD, mild MR, mild LAE  . Mild mitral regurgitation by prior echocardiogram   . Chicken pox   . GERD (gastroesophageal reflux disease)   . Asthma     childhood  . Perioperative myocardial infarction     Perioperative troponin elevation in 2007 after the patient's uvuloplasty.  He did have some chest pain at the time as well.  Left heart catheterization at that time showed normal coronary arteries and normal renal  arteries, EF was 50%.    Past Surgical History  Procedure Laterality Date  . Cardiac catheterization      EF of 50% with Normal coronary arteries, normal renal  arteries    Family History  Problem Relation Age of Onset  . Hypertension Father     family history of early onset  . Hypertension Mother   . Diabetes Mother     History   Social History  . Marital Status: Single    Spouse Name: N/A    Number of Children: N/A  . Years of Education: N/A   Social History Main Topics  . Smoking status: Never Smoker   . Smokeless tobacco: None  . Alcohol Use: Yes     Comment: few drinks occ  . Drug Use: None  . Sexual Activity: None   Other Topics Concern  . 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 , Disp: , Rfl: ;  irbesartan-hydrochlorothiazide (AVALIDE) 150-12.5 MG per tablet, Take 1 tablet by mouth daily., Disp: 30 tablet, Rfl: 11 nebivolol (BYSTOLIC) 5 MG tablet, TAKE 1 TABLET (5 MG TOTAL) BY MOUTH DAILY., Disp: 30 tablet, Rfl: 11;  sildenafil (VIAGRA)  50 MG tablet, Take 1 tablet (50 mg total) by mouth daily as needed for erectile dysfunction., Disp: 10 tablet, Rfl: 1;  terazosin (HYTRIN) 5 MG capsule, , Disp: , Rfl:   EXAM:  Filed Vitals:   07/19/13 0838  BP: 124/80  Pulse: 51  Temp: 98.2 F (36.8 C)    Body mass index is 38.95 kg/(m^2).  GENERAL: vitals reviewed and listed above, alert, oriented, appears well hydrated and in no acute distress  HEENT: atraumatic, conjunttiva clear, no obvious abnormalities on inspection of external nose and ears  NECK: no obvious masses on inspection  LUNGS: clear to auscultation bilaterally, no wheezes, rales or rhonchi, good air movement  CV: HRRR,  no peripheral edema  MS: moves all extremities without noticeable abnormality  PSYCH: pleasant and cooperative, no obvious depression or anxiety  ASSESSMENT AND PLAN:  Discussed the following assessment and plan:  HYPERTENSION  OBESITY  PREMATURE ATRIAL CONTRACTIONS  CHEST PAIN  Palpitations  -noncompliant with recommendations, advise recheck labs but he does not want to do this today and prefers to return in 3 months after diet and exercise as he reports would not want to start medication for cholesterol if high -will have assistant look into cpap/old sleep study -follow up 3 months -Patient advised to return or notify a doctor immediately if symptoms worsen or persist or new concerns arise.  There are no Patient Instructions on file for this visit.   Colin Benton R.

## 2013-07-19 NOTE — Progress Notes (Signed)
Pre visit review using our clinic review tool, if applicable. No additional management support is needed unless otherwise documented below in the visit note. 

## 2013-07-19 NOTE — Patient Instructions (Signed)
  We recommend the following healthy lifestyle measures: - eat a healthy diet consisting of lots of vegetables, fruits, beans, nuts, seeds, healthy meats such as white chicken and fish and whole grains.  - avoid fried foods, fast food, processed foods, sodas, red meet and other fattening foods.  - get a least 150 minutes of aerobic exercise per week.   Follow up in: 3 months, check labs then

## 2013-07-25 ENCOUNTER — Telehealth: Payer: Self-pay | Admitting: *Deleted

## 2013-07-25 NOTE — Telephone Encounter (Signed)
Patient was seen on 07/19/2013 and informed Dr Maudie Mercury he would like to use a new medical supply company instead of [facility] for a new CPAP and supplies. A patient recommended [facility] (XN#[id number]), Dr Maudie Mercury wrote the Rx and I called this number and spoke with Santiago Glad and she stated to fax the orders and sleep study to [facility] at [phone].  I called the pt on 07/19/2013 and advised him of this and he stated he wanted to check the company out first and will call us back. As of 07/25/2013 the pt has not returned my call.

## 2013-08-29 ENCOUNTER — Other Ambulatory Visit: Payer: Self-pay | Admitting: Physician Assistant

## 2013-10-04 ENCOUNTER — Other Ambulatory Visit: Payer: Self-pay | Admitting: Cardiology

## 2013-10-06 ENCOUNTER — Encounter: Payer: Self-pay | Admitting: Family Medicine

## 2013-10-06 ENCOUNTER — Ambulatory Visit (INDEPENDENT_AMBULATORY_CARE_PROVIDER_SITE_OTHER): Payer: 59 | Admitting: Family Medicine

## 2013-10-06 VITALS — BP 124/82 | HR 65 | Temp 97.8°F | Ht 69.5 in | Wt 269.0 lb

## 2013-10-06 DIAGNOSIS — J069 Acute upper respiratory infection, unspecified: Secondary | ICD-10-CM

## 2013-10-06 NOTE — Progress Notes (Signed)
Pre visit review using our clinic review tool, if applicable. No additional management support is needed unless otherwise documented below in the visit note. 

## 2013-10-06 NOTE — Progress Notes (Signed)
No chief complaint on file.   HPI:  Sinus Congestion: -started: 3 days ago -symptoms:nasal congestion, sore throat, cough, drainage, mild facial pressure/pain, mild ear pain, mild SOB -denies:fever, SOB, NVD -has tried: nasal saline -sick contacts/travel/risks: denies flu exposure, tick exposure or or Ebola risks -Hx of: allergies  ROS: See pertinent positives and negatives per HPI.  Past Medical History  Diagnosis Date  . Hypertension     high blood pressure readings   . Heart palpitations   . Obstructive sleep apnea   . Obesity   . History of echocardiogram     (5/09) showed EF 50-55%, mild LV dilation, mild LV hypertrophy, pseudonormal  diastolic function, mild mitral regurgitation, and mild left atrial enlargement.;  Echo 8/14:  Mild LVH, EF 55-60%, Gr 2 DD, mild MR, mild LAE  . Mild mitral regurgitation by prior echocardiogram   . Chicken pox   . GERD (gastroesophageal reflux disease)   . Asthma     childhood  . Perioperative myocardial infarction     Perioperative troponin elevation in 2007 after the patient's uvuloplasty.  He did have some chest pain at the time as well.  Left heart catheterization at that time showed normal coronary arteries and normal renal  arteries, EF was 50%.    Past Surgical History  Procedure Laterality Date  . Cardiac catheterization      EF of 50% with Normal coronary arteries, normal renal arteries    Family History  Problem Relation Age of Onset  . Hypertension Father     family history of early onset  . Hypertension Mother   . Diabetes Mother     History   Social History  . Marital Status: Single    Spouse Name: N/A    Number of Children: N/A  . Years of Education: N/A   Social History Main Topics  . Smoking status: Never Smoker   . Smokeless tobacco: None  . Alcohol Use: Yes     Comment: few drinks occ  . Drug Use: None  . Sexual Activity: None   Other Topics Concern  . 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 , Disp: , Rfl:  irbesartan-hydrochlorothiazide (AVALIDE) 150-12.5 MG per tablet, TAKE 1 TABLET BY MOUTH DAILY., Disp: 30 tablet, Rfl: 0;  sildenafil (VIAGRA) 50 MG tablet, Take 1 tablet (50 mg total) by mouth daily as needed for erectile dysfunction., Disp: 10 tablet, Rfl: 1;  terazosin (HYTRIN) 5 MG capsule, , Disp: , Rfl:   EXAM:  Filed Vitals:   10/06/13 0834  BP: 124/82  Pulse: 65  Temp: 97.8 F (36.6 C)    Body mass index is 39.17 kg/(m^2).  GENERAL: vitals reviewed and listed above, alert, oriented, appears well hydrated and in no acute distress  HEENT: atraumatic, conjunttiva clear, no obvious abnormalities on inspection  of external nose and ears, normal appearance of ear canals and TMs, clear nasal congestion, mild post oropharyngeal erythema with PND, no tonsillar edema or exudate, no sinus TTP  NECK: no obvious masses on inspection  LUNGS: clear to auscultation bilaterally, no wheezes, rales or rhonchi, good air movement  CV: HRRR, no peripheral edema  MS: moves all extremities without noticeable abnormality  PSYCH: pleasant and cooperative, no obvious depression or anxiety  ASSESSMENT AND PLAN:  Discussed the following assessment and plan:  Upper respiratory infection  -given HPI and exam findings today, a serious infection or illness is unlikely. We discussed potential etiologies, with VURI being most likely, and advised supportive care  and monitoring. We discussed treatment side effects, likely course, antibiotic misuse, transmission, and signs of developing a serious illness. -of course, we advised to return or notify a doctor immediately if symptoms worsen or persist or new concerns arise.    There are no Patient Instructions on file for this visit.   Colin Benton R.

## 2013-10-06 NOTE — Patient Instructions (Signed)
INSTRUCTIONS FOR UPPER RESPIRATORY INFECTION:  -plenty of rest and fluids  -nasal saline wash 2-3 times daily (use prepackaged nasal saline or bottled/distilled water if making your own)   -can use afrin or sinex nasal spray for drainage and nasal congestion - but do NOT use longer then 3-4 days  -can use tylenol or ibuprofen as directed for aches and sorethroat  -in the winter time, using a humidifier at night is helpful (please follow cleaning instructions)  -if you are taking a cough medication - use only as directed, may also try a teaspoon of honey to coat the throat and throat lozenges  -for sore throat, salt water gargles can help  -follow up if you have fevers, facial pain, tooth pain, difficulty breathing or are worsening or not getting better in 5-7 days

## 2013-10-17 ENCOUNTER — Encounter: Payer: Self-pay | Admitting: Internal Medicine

## 2013-10-19 ENCOUNTER — Ambulatory Visit: Payer: 59 | Admitting: Family Medicine

## 2013-10-23 ENCOUNTER — Telehealth: Payer: Self-pay | Admitting: Family Medicine

## 2013-10-23 ENCOUNTER — Other Ambulatory Visit: Payer: Self-pay | Admitting: *Deleted

## 2013-10-23 ENCOUNTER — Ambulatory Visit (INDEPENDENT_AMBULATORY_CARE_PROVIDER_SITE_OTHER): Payer: 59 | Admitting: Family Medicine

## 2013-10-23 ENCOUNTER — Encounter: Payer: Self-pay | Admitting: Family Medicine

## 2013-10-23 VITALS — BP 130/78 | HR 62 | Temp 98.8°F | Ht 69.5 in | Wt 271.5 lb

## 2013-10-23 DIAGNOSIS — L03011 Cellulitis of right finger: Secondary | ICD-10-CM

## 2013-10-23 MED ORDER — CEFTRIAXONE SODIUM 1 G IJ SOLR
500.0000 mg | Freq: Once | INTRAMUSCULAR | Status: AC
  Administered 2013-10-23: 500 mg via INTRAMUSCULAR

## 2013-10-23 MED ORDER — DOXYCYCLINE HYCLATE 100 MG PO TABS: 100.0000 mg | ORAL_TABLET | Freq: Two times a day (BID) | ORAL | Status: DC

## 2013-10-23 NOTE — Telephone Encounter (Signed)
Rx done. 

## 2013-10-23 NOTE — Telephone Encounter (Signed)
Patient Information:  Caller Name: [name]  Phone: [phone]  Patient: Samuel, Wiley  Gender: Male  DOB: 16-May-1970  Age: 43 Years  PCP: Maudie Mercury (TEXT 1st, after 20 mins can call), Jarrett Soho [facility])  Office Follow Up:  Does the office need to follow up with this patient?: No  Instructions For The Office: N/A   Symptoms  Reason For Call & Symptoms: was stuck by a fish fin yesterday in his rt index finger; says he can't see the place where it stuck him; says it is swollen and he can not  bend the top joint; knuckle joint is sticff, but will bend; feels warm, but is not red; hand is sore  Reviewed Health History In EMR: Yes  Reviewed Medications In EMR: Yes  Reviewed Allergies In EMR: Yes  Reviewed Surgeries / Procedures: Yes  Date of Onset of Symptoms: 10/22/2013  Guideline(s) Used:  Finger Injury  Disposition Per Guideline:   See Today in Office  Reason For Disposition Reached:   Finger joint can't be opened (straightened) or closed (bent) completely  Advice Given:  N/A  Patient Will Follow Care Advice:  YES  Appointment Scheduled:  10/23/2013 14:15:00 Appointment Scheduled Provider:  Maudie Mercury (TEXT 1st, after 20 mins can call), Jarrett Soho [facility])

## 2013-10-23 NOTE — Addendum Note (Signed)
Addended by: Lahoma Crocker A on: 10/23/2013 03:00 PM   Modules accepted: Orders

## 2013-10-23 NOTE — Progress Notes (Signed)
No chief complaint on file.   HPI:  Acute visit for:  1) Finger Pain: -"stuck by a dorsal fin of a tilapia" at a [place] yesterday -R index finger -now with pain and a little swelling in this area -denies: fever, malaise, redness ROS: See pertinent positives and negatives per HPI.  Past Medical History  Diagnosis Date  . Hypertension     high blood pressure readings   . Heart palpitations   . Obstructive sleep apnea   . Obesity   . History of echocardiogram     (5/09) showed EF 50-55%, mild LV dilation, mild LV hypertrophy, pseudonormal  diastolic function, mild mitral regurgitation, and mild left atrial enlargement.;  Echo 8/14:  Mild LVH, EF 55-60%, Gr 2 DD, mild MR, mild LAE  . Mild mitral regurgitation by prior echocardiogram   . Chicken pox   . GERD (gastroesophageal reflux disease)   . Asthma     childhood  . Perioperative myocardial infarction     Perioperative troponin elevation in 2007 after the patient's uvuloplasty.  He did have some chest pain at the time as well.  Left heart catheterization at that time showed normal coronary arteries and normal renal  arteries, EF was 50%.    Past Surgical History  Procedure Laterality Date  . Cardiac catheterization      EF of 50% with Normal coronary arteries, normal renal arteries    Family History  Problem Relation Age of Onset  . Hypertension Father     family history of early onset  . Hypertension Mother   . Diabetes Mother     History   Social History  . Marital Status: Single    Spouse Name: N/A    Number of Children: N/A  . Years of Education: N/A   Social History Main Topics  . Smoking status: Never Smoker   . Smokeless tobacco: None  . Alcohol Use: Yes     Comment: few drinks occ  . Drug Use: None  . Sexual Activity: None   Other Topics Concern  . 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 , Disp: , Rfl:  irbesartan-hydrochlorothiazide (AVALIDE) 150-12.5 MG per tablet, TAKE 1 TABLET BY MOUTH DAILY., Disp: 30 tablet, Rfl: 0;  sildenafil (VIAGRA) 50 MG tablet, Take 1 tablet (50 mg total) by mouth daily as needed for erectile dysfunction., Disp: 10 tablet, Rfl: 1;  terazosin (HYTRIN) 5 MG capsule, , Disp: , Rfl:   EXAM:  Filed Vitals:   10/23/13 1411  BP: 130/78  Pulse: 62  Temp: 98.8 F (37.1 C)    Body mass index is 39.53 kg/(m^2).  GENERAL: vitals reviewed and listed above, alert, oriented, appears well hydrated and in no acute distress  HEENT: atraumatic, conjunttiva clear, no obvious abnormalities on inspection of external nose and ears  NECK: no obvious masses on inspection  MS: moves all extremities without noticeable abnormality Very small area of ? Swelling dorsal distal index finger R hand with TTP here, no lesion seen  PSYCH: pleasant and cooperative, no obvious depression or anxiety  ASSESSMENT AND PLAN:  Discussed the following assessment and plan:  Cellulitis of finger of right hand  -possible developing mild cellulitis from injury from fin of fish - minimal findings on exam today but he subjectively reports mild swelling and pain -good skin and vibrio coverage with Rocephin here and doxy after discussion risks an dbenefits -close follow up, advised to go to ED if worsening -Patient advised to return or notify a doctor immediately if symptoms worsen or persist or new concerns arise.  Patient Instructions  FOLLOW up in 2  days  Take the doxycycline starting right away  Go to the emergency room immediately if any worsening     Michaella Imai R.

## 2013-10-23 NOTE — Telephone Encounter (Signed)
Yes - please send =- I thought was sent at appt. Pkease apologize to him.

## 2013-10-23 NOTE — Progress Notes (Signed)
Pre visit review using our clinic review tool, if applicable. No additional management support is needed unless otherwise documented below in the visit note. 

## 2013-10-23 NOTE — Patient Instructions (Signed)
FOLLOW up in 2 days  Take the doxycycline starting right away  Go to the emergency room immediately if any worsening

## 2013-10-23 NOTE — Telephone Encounter (Addendum)
Pt seen today for finger inf. Pt was to have doxycycline called into pharm. Can you send to [facility] [address]?  thanks

## 2013-10-23 NOTE — Telephone Encounter (Signed)
Rx was sent.  Patient informed.

## 2013-10-25 ENCOUNTER — Ambulatory Visit (INDEPENDENT_AMBULATORY_CARE_PROVIDER_SITE_OTHER): Payer: 59 | Admitting: Family Medicine

## 2013-10-25 ENCOUNTER — Encounter: Payer: Self-pay | Admitting: Family Medicine

## 2013-10-25 VITALS — BP 122/70 | HR 68 | Temp 98.0°F | Ht 69.5 in | Wt 271.0 lb

## 2013-10-25 DIAGNOSIS — L03011 Cellulitis of right finger: Secondary | ICD-10-CM

## 2013-10-25 NOTE — Progress Notes (Signed)
Pre visit review using our clinic review tool, if applicable. No additional management support is needed unless otherwise documented below in the visit note. 

## 2013-10-25 NOTE — Progress Notes (Signed)
No chief complaint on file.   HPI:  Follow up cellulitis: -R dorsal index finger -possible mild cellulitis following prick by "dorsal [place] of a [place]" [place] [employer] on 10[place]22/13 -treated with rocephin and doxy -reports: doing much better - pain and swelling reduced -denies: worsening, redness, fever, malaise  ROS: See pertinent positives and negatives per HPI.  Past Medical History  Diagnosis Date  . Hypertension     high blood pressure readings   . Heart palpitations   . Obstructive sleep apnea   . Obesity   . History of echocardiogram     (5/09) showed EF 50-55%, mild LV dilation, mild LV hypertrophy, pseudonormal  diastolic function, mild mitral regurgitation, and mild left atrial enlargement.;  Echo 8/14:  Mild LVH, EF 55-60%, Gr 2 DD, mild MR, mild LAE  . Mild mitral regurgitation by prior echocardiogram   . Chicken pox   . GERD (gastroesophageal reflux disease)   . Asthma     childhood  . Perioperative myocardial infarction     Perioperative troponin elevation in 2007 after the patient's uvuloplasty.  He did have some chest pain at the time as well.  Left heart catheterization at that time showed normal coronary arteries and normal renal  arteries, EF was 50%.    Past Surgical History  Procedure Laterality Date  . Cardiac catheterization      EF of 50% with Normal coronary arteries, normal renal arteries    Family History  Problem Relation Age of Onset  . Hypertension Father     family history of early onset  . Hypertension Mother   . Diabetes Mother     History   Social History  . Marital Status: Single    Spouse Name: N/A    Number of Children: N/A  . Years of Education: N/A   Social History Main Topics  . Smoking status: Never Smoker   . Smokeless tobacco: None  . Alcohol Use: Yes     Comment: few drinks occ  . Drug Use: None  . Sexual Activity: None   Other Topics Concern  . 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 , Disp: , Rfl:  doxycycline (VIBRA-TABS) 100 MG tablet, Take 1 tablet (100 mg total) by mouth 2 (two) times daily., Disp: 20 tablet, Rfl: 0;  irbesartan-hydrochlorothiazide (AVALIDE) 150-12.5 MG per tablet, TAKE 1 TABLET BY MOUTH DAILY., Disp: 30 tablet, Rfl: 0;  sildenafil (VIAGRA) 50 MG tablet, Take 1 tablet (50 mg total) by mouth daily as needed for erectile dysfunction., Disp: 10 tablet, Rfl: 1;  terazosin (HYTRIN) 5 MG capsule, , Disp: , Rfl:   EXAM:  Filed Vitals:   10/25/13 1005  BP: 122/70  Pulse: 68  Temp: 98 F (36.7 C)    Body mass index is 39.46 kg/(m^2).  GENERAL: vitals  reviewed and listed above, alert, oriented, appears well hydrated and in no acute distress  HEENT: atraumatic, conjunttiva clear, no obvious abnormalities on inspection of external nose and ears  NECK: no obvious masses on inspection  SKIN: mild TTP R dorsal index finger, no swelling or erythema  MS: moves all extremities without noticeable abnormality  PSYCH: pleasant and cooperative, no obvious depression or anxiety  ASSESSMENT AND PLAN:  Discussed the following assessment and plan:  Cellulitis of finger of right hand  Improving on antibiotic, complete antibiotic Follow up as needed -Patient advised to return or notify a doctor immediately if symptoms worsen or persist or new concerns arise.  There are no Patient Instructions on file for this visit.   Samuel Benton R.

## 2013-11-09 ENCOUNTER — Encounter: Payer: Self-pay | Admitting: Family Medicine

## 2013-11-09 ENCOUNTER — Ambulatory Visit (INDEPENDENT_AMBULATORY_CARE_PROVIDER_SITE_OTHER): Payer: 59 | Admitting: Family Medicine

## 2013-11-09 VITALS — BP 118/82 | HR 59 | Temp 97.9°F | Ht 69.5 in | Wt 269.6 lb

## 2013-11-09 DIAGNOSIS — Z9989 Dependence on other enabling machines and devices: Secondary | ICD-10-CM

## 2013-11-09 DIAGNOSIS — G4733 Obstructive sleep apnea (adult) (pediatric): Secondary | ICD-10-CM

## 2013-11-09 DIAGNOSIS — R7303 Prediabetes: Secondary | ICD-10-CM

## 2013-11-09 DIAGNOSIS — I10 Essential (primary) hypertension: Secondary | ICD-10-CM

## 2013-11-09 DIAGNOSIS — E669 Obesity, unspecified: Secondary | ICD-10-CM

## 2013-11-09 DIAGNOSIS — E785 Hyperlipidemia, unspecified: Secondary | ICD-10-CM

## 2013-11-09 DIAGNOSIS — R7309 Other abnormal glucose: Secondary | ICD-10-CM

## 2013-11-09 LAB — BASIC METABOLIC PANEL
BUN: 22 mg/dL (ref 6–23)
CO2: 22 mEq/L (ref 19–32)
CREATININE: 1.3 mg/dL (ref 0.4–1.5)
Calcium: 9.5 mg/dL (ref 8.4–10.5)
Chloride: 105 mEq/L (ref 96–112)
GFR: 78.21 mL/min (ref >60.00)
Glucose, Bld: 115 mg/dL — ABNORMAL HIGH (ref 70–99)
Potassium: 4.7 mEq/L (ref 3.5–5.1)
Sodium: 141 mEq/L (ref 135–145)

## 2013-11-09 LAB — LIPID PANEL
CHOLESTEROL: 154 mg/dL (ref 0–200)
HDL: 43.9 mg/dL (ref >39.00)
LDL Cholesterol: 103 mg/dL — ABNORMAL HIGH (ref 0–99)
NonHDL: 110.1
Total CHOL/HDL Ratio: 4
Triglycerides: 35 mg/dL (ref 0.0–149.0)
VLDL: 7 mg/dL (ref 0.0–40.0)

## 2013-11-09 LAB — HEMOGLOBIN A1C: HEMOGLOBIN A1C: 6.2 % (ref 4.6–6.5)

## 2013-11-09 MED ORDER — IRBESARTAN-HYDROCHLOROTHIAZIDE 150-12.5 MG PO TABS: ORAL_TABLET | ORAL | Status: DC

## 2013-11-09 MED ORDER — AMLODIPINE BESYLATE 10 MG PO TABS
ORAL_TABLET | ORAL | Status: DC
Start: 2013-11-09 — End: 2014-11-14

## 2013-11-09 MED ORDER — NEBIVOLOL HCL 5 MG PO TABS: ORAL_TABLET | ORAL | Status: DC

## 2013-11-09 NOTE — Progress Notes (Signed)
No chief complaint on file.   HPI:  Prediabetes/Obesity:  -advised lifestyle changes  -reports: no regular exercise- reports doe snot have time, diet is not great  -denies: vision changes, polyuria, polydipsia, wounds   Hyperlipidemia:  -advised sig lifestyle changes and close follow up to recheck and start statin if needed  -reports: wants to recheck and work on lifestyle changes -denies: skin changes   Resistant HTN/LVH, OSA:  -meds:norvasc 10, asa, irbesartan-hctz(avalide) 150-12.5, nebivolol  -managed by his cardiologist  -wears cpap - thinks getting old and thinks needs new machine, from [facility] - does not want to get equipment from there any more as they were cheating his insurance  -Settings are 18 mmg Hg  -denies: CP, sob, swelling, syncope   ED:  -followed by [facility]  -denies: priapism, LUTS worsening   ROS: See pertinent positives and negatives per HPI.  Past Medical History  Diagnosis Date  . Hypertension     high blood pressure readings   . Heart palpitations   . Obstructive sleep apnea   . Obesity   . History of echocardiogram     (5/09) showed EF 50-55%, mild LV dilation, mild LV hypertrophy, pseudonormal  diastolic function, mild mitral regurgitation, and mild left atrial enlargement.;  Echo 8/14:  Mild LVH, EF 55-60%, Gr 2 DD, mild MR, mild LAE  . Mild mitral regurgitation by prior echocardiogram   . Chicken pox   . GERD (gastroesophageal reflux disease)   . Asthma     childhood  . Perioperative myocardial infarction     Perioperative troponin elevation in 2007 after the patient's uvuloplasty.  He did have some chest pain at the time as well.  Left heart catheterization at that time showed normal coronary arteries and normal renal  arteries, EF was 50%.    Past Surgical History  Procedure Laterality Date  . Cardiac catheterization      EF of 50% with Normal coronary arteries, normal renal arteries    Family History  Problem  Relation Age of Onset  . Hypertension Father     family history of early onset  . Hypertension Mother   . Diabetes Mother     History   Social History  . Marital Status: Single    Spouse Name: N/A    Number of Children: N/A  . Years of Education: N/A   Social History Main Topics  . Smoking status: Never Smoker   . Smokeless tobacco: None  . Alcohol Use: Yes     Comment: few drinks occ  . Drug Use: None  . Sexual Activity: None   Other Topics Concern  . 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 , Disp: , Rfl: ;  irbesartan-hydrochlorothiazide (AVALIDE) 150-12.5 MG per tablet, TAKE 1 TABLET BY MOUTH DAILY., Disp: 90 tablet, Rfl: 3 nebivolol (BYSTOLIC) 5 MG tablet, TAKE 1 TABLET (5 MG TOTAL) BY MOUTH DAILY., Disp: 90 tablet, Rfl:  3;  sildenafil (VIAGRA) 50 MG tablet, Take 1 tablet (50 mg total) by mouth daily as needed for erectile dysfunction., Disp: 10 tablet, Rfl: 1;  terazosin (HYTRIN) 5 MG capsule, , Disp: , Rfl:   EXAM:  Filed Vitals:   11/09/13 0853  BP: 118/82  Pulse: 59  Temp: 97.9 F (36.6 C)    Body mass index is 39.26 kg/(m^2).  GENERAL: vitals reviewed and listed above, alert, oriented, appears well hydrated and in no acute distress  HEENT: atraumatic, conjunttiva clear, no obvious abnormalities on inspection of external nose and ears  NECK: no obvious masses on inspection  LUNGS: clear to auscultation bilaterally, no wheezes, rales or rhonchi, good air movement  CV: HRRR, no peripheral edema  MS: moves all  extremities without noticeable abnormality  PSYCH: pleasant and cooperative, no obvious depression or anxiety  ASSESSMENT AND PLAN:  Discussed the following assessment and plan:  Essential hypertension - Plan: Basic metabolic panel, amLODipine (NORVASC) 10 MG tablet, nebivolol (BYSTOLIC) 5 MG tablet, irbesartan-hydrochlorothiazide (AVALIDE) 150-12.5 MG per tablet  Hyperlipemia - Plan: Lipid Panel  Obesity  Prediabetes - Plan: Hemoglobin A1c  OSA on CPAP  -FASTING labs -discussed statin therapy risks/benefits in case needed -stressed importance of regular CV exercise and heathy diet -refilled medications -Patient advised to return or notify a doctor immediately if symptoms worsen or persist or new concerns arise.  Patient Instructions  We recommend the following healthy lifestyle measures: - eat a healthy diet consisting of lots of vegetables, fruits, beans, nuts, seeds, healthy meats such as white chicken and fish and whole grains.  - avoid fried foods, fast food, processed foods, sodas, red meet and other fattening foods.  - get a least 150 minutes of aerobic exercise per week.   -We have ordered labs or studies at this visit. It can take up to 1-2 weeks for results and processing. We will contact you with instructions IF your results are abnormal. Normal results will be released to your [facility]. If you have not heard from [place] or can not find your results in [facility] in 2 weeks please contact our office.            Colin Benton R.

## 2013-11-09 NOTE — Patient Instructions (Signed)
We recommend the following healthy lifestyle measures: - eat a healthy diet consisting of lots of vegetables, fruits, beans, nuts, seeds, healthy meats such as white chicken and fish and whole grains.  - avoid fried foods, fast food, processed foods, sodas, red meet and other fattening foods.  - get a least 150 minutes of aerobic exercise per week.   -We have ordered labs or studies at this visit. It can take up to 1-2 weeks for results and processing. We will contact you with instructions IF your results are abnormal. Normal results will be released to your MYCHART. If you have not heard from us or can not find your results in MYCHART in 2 weeks please contact our office.        

## 2013-11-09 NOTE — Progress Notes (Signed)
Pre visit review using our clinic review tool, if applicable. No additional management support is needed unless otherwise documented below in the visit note. 

## 2013-12-13 ENCOUNTER — Ambulatory Visit (INDEPENDENT_AMBULATORY_CARE_PROVIDER_SITE_OTHER): Payer: 59 | Admitting: Family Medicine

## 2013-12-13 ENCOUNTER — Encounter: Payer: Self-pay | Admitting: Family Medicine

## 2013-12-13 ENCOUNTER — Ambulatory Visit: Payer: 59 | Admitting: Family Medicine

## 2013-12-13 VITALS — BP 118/82 | HR 64 | Temp 98.2°F | Ht 69.5 in | Wt 247.2 lb

## 2013-12-13 DIAGNOSIS — F32A Depression, unspecified: Secondary | ICD-10-CM

## 2013-12-13 DIAGNOSIS — F39 Unspecified mood [affective] disorder: Secondary | ICD-10-CM

## 2013-12-13 DIAGNOSIS — F329 Major depressive disorder, single episode, unspecified: Secondary | ICD-10-CM

## 2013-12-13 NOTE — Progress Notes (Signed)
HPI:  Acute visit for:  Depression GAD: On and off for many years, severe episodes of severe sadness, fear, obsessive thoughts - had severe episode several years ago - went to behavioral health and they wanted to hospitalize him but he refused - he ate lots of cake and cried for one week and things improved For the last month has had depressed mood, worsening thoughts of fear of everything keeps him from sleeping, checks the doors and windows frequently, hypochondriac, isolating, sadness - previous girlfriend moved on, loss of appetite, womanizes, drives car faster then usual, emotionally numb for many years. Wants recommendations for a psychiatrist Depression Symptoms: Interest deficit/anhedonia: yes Sleep disorder: yes Guilt (worthlessness, hopelessness, regret): yes Energy deficit: yes Concentration deficit: yes Appetite disorder: yes - reports always loses his appetite and stops eating with these spells Psychomotor retardation or agitation: yes Suicidality: no, no homicidal thoughts - does have thoughts of starving himself and is afraid of this as has done it before He reports he is seeing a counselor - Mickel Crow, whom thinks he has OCD Previous hx of hallucination and auditory hallucinations Mania Symptoms: Distractibility and easy frustration: yes Irresponsibility/erratic/uninhibited behavior: yes Grandiosity: no Flight of Ideas: yes Activity - increased with weight loss and increased libido: yes Sleep decreased:yes Talkative: no   ROS: See pertinent positives and negatives per HPI.  Past Medical History  Diagnosis Date  . Hypertension     high blood pressure readings   . Heart palpitations   . Obstructive sleep apnea   . Obesity   . History of echocardiogram     (5/09) showed EF 50-55%, mild LV dilation, mild LV hypertrophy, pseudonormal  diastolic function, mild mitral regurgitation, and mild left atrial enlargement.;  Echo 8/14:  Mild LVH, EF 55-60%, Gr 2 DD, mild  MR, mild LAE  . Mild mitral regurgitation by prior echocardiogram   . Chicken pox   . GERD (gastroesophageal reflux disease)   . Asthma     childhood  . Perioperative myocardial infarction     Perioperative troponin elevation in 2007 after the patient's uvuloplasty.  He did have some chest pain at the time as well.  Left heart catheterization at that time showed normal coronary arteries and normal renal  arteries, EF was 50%.    Past Surgical History  Procedure Laterality Date  . Cardiac catheterization      EF of 50% with Normal coronary arteries, normal renal arteries    Family History  Problem Relation Age of Onset  . Hypertension Father     family history of early onset  . Hypertension Mother   . Diabetes Mother     History   Social History  . Marital Status: Single    Spouse Name: N/A    Number of Children: N/A  . Years of Education: N/A   Social History Main Topics  . Smoking status: Never Smoker   . Smokeless tobacco: None  . Alcohol Use: Yes     Comment: few drinks occ  . Drug Use: None  . Sexual Activity: None   Other Topics Concern  . 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 , Disp: , Rfl: ;  irbesartan-hydrochlorothiazide (AVALIDE) 150-12.5 MG per tablet, TAKE 1 TABLET BY MOUTH DAILY., Disp: 90 tablet, Rfl: 3 nebivolol (BYSTOLIC) 5 MG tablet, TAKE 1 TABLET (5 MG TOTAL) BY MOUTH DAILY., Disp: 90 tablet, Rfl: 3;  terazosin (HYTRIN) 5 MG capsule, , Disp: , Rfl: ;  sildenafil (VIAGRA) 50 MG tablet, Take 1 tablet (50 mg  total) by mouth daily as needed for erectile dysfunction., Disp: 10 tablet, Rfl: 1  EXAM:  Filed Vitals:   12/13/13 0813  BP: 118/82  Pulse: 64  Temp: 98.2 F (36.8 C)    Body mass index is 35.99 kg/(m^2).  GENERAL: vitals reviewed and listed above, alert, oriented, appears well hydrated and in no acute distress  HEENT: atraumatic, conjunttiva clear, no obvious abnormalities on inspection of external nose and ears  NECK: no obvious masses on inspection  LUNGS: clear to auscultation bilaterally, no wheezes, rales or rhonchi, good air movement  CV: HRRR, no peripheral edema  MS: moves all extremities without noticeable abnormality  PSYCH: pleasant and cooperative, anxious, flat affect, fidgeting  ASSESSMENT AND PLAN:  Discussed the following assessment and plan:  Depression  Episodic mood disorder  -discussed his symptoms, concerns, possible diagnoses and treatment options -advised labs given weight loss which he refused as he reports this is what happens during these episodes and is not concerned for any other cause of weight loss -query BD, OCD he wishes to see a psychiatrist for medication management - list of psychiatrist provided for him to contact -advised to seek emergency care immediately is SI, thoughts of self harm or harm to others or worsening or severe symptoms -continue counseling -Patient advised to return or notify a doctor immediately if symptoms worsen or persist or new concerns arise.  Patient Instructions  Please call the psychiatrist today for an appointment  See emergency care immediately at the hospital if symptoms are worsening, severe or thoughts of self harm or harm to others(call [phone])  Please continue counseling   Please try to eat 3 meals per day - consider boost or ensure drink 1-2 times daily if unable to eat well  Please follow up with me in 1-2 months to ensure weight is improving/check labs     KIM, [place]

## 2013-12-13 NOTE — Patient Instructions (Signed)
Please call the psychiatrist today for an appointment  See emergency care immediately at the hospital if symptoms are worsening, severe or thoughts of self harm or harm to others(call [phone])  Please continue counseling   Please try to eat 3 meals per day - consider boost or ensure drink 1-2 times daily if unable to eat well  Please follow up with me in 1-2 months to ensure weight is improving/check labs

## 2013-12-13 NOTE — Progress Notes (Signed)
Pre visit review using our clinic review tool, if applicable. No additional management support is needed unless otherwise documented below in the visit note. 

## 2013-12-27 ENCOUNTER — Telehealth: Payer: Self-pay | Admitting: Family Medicine

## 2013-12-27 NOTE — Telephone Encounter (Signed)
Pt called to ask for a referral to see a psychiatrists Samuel Wiley

## 2013-12-28 NOTE — Telephone Encounter (Signed)
I called the Samuel Wiley and advised him per Dr Maudie Mercury this physician is a physiatrist and I informed the Samuel Wiley the pamphlet with counselor information will be left at the front desk for him to pick up.

## 2013-12-28 NOTE — Telephone Encounter (Signed)
Pt is aware waiting on dr Maudie Mercury

## 2013-12-28 NOTE — Telephone Encounter (Signed)
I do not know of a Samuel Wiley tat in psychiatry. Please get further info from patient or place general referral to psych if he is ok with this - though usually they have him call and numbers provided at his appointment.

## 2014-01-24 ENCOUNTER — Ambulatory Visit (INDEPENDENT_AMBULATORY_CARE_PROVIDER_SITE_OTHER): Payer: 59 | Admitting: Family Medicine

## 2014-01-24 ENCOUNTER — Encounter: Payer: Self-pay | Admitting: Family Medicine

## 2014-01-24 VITALS — BP 110/72 | HR 55 | Temp 98.0°F | Ht 69.5 in | Wt 239.7 lb

## 2014-01-24 DIAGNOSIS — I10 Essential (primary) hypertension: Secondary | ICD-10-CM

## 2014-01-24 DIAGNOSIS — R079 Chest pain, unspecified: Secondary | ICD-10-CM

## 2014-01-24 DIAGNOSIS — R002 Palpitations: Secondary | ICD-10-CM

## 2014-01-24 DIAGNOSIS — F329 Major depressive disorder, single episode, unspecified: Secondary | ICD-10-CM

## 2014-01-24 DIAGNOSIS — R634 Abnormal weight loss: Secondary | ICD-10-CM

## 2014-01-24 DIAGNOSIS — F32A Depression, unspecified: Secondary | ICD-10-CM

## 2014-01-24 LAB — COMPREHENSIVE METABOLIC PANEL
ALK PHOS: 69 U/L (ref 39–117)
ALT: 15 U/L (ref 0–53)
AST: 15 U/L (ref 0–37)
Albumin: 4.2 g/dL (ref 3.5–5.2)
BILIRUBIN TOTAL: 0.5 mg/dL (ref 0.2–1.2)
BUN: 17 mg/dL (ref 6–23)
CHLORIDE: 103 meq/L (ref 96–112)
CO2: 28 mEq/L (ref 19–32)
CREATININE: 1.04 mg/dL (ref 0.40–1.50)
Calcium: 9.5 mg/dL (ref 8.4–10.5)
GFR: 99.29 mL/min (ref >60.00)
GLUCOSE: 114 mg/dL — AB (ref 70–99)
POTASSIUM: 4.3 meq/L (ref 3.5–5.1)
SODIUM: 139 meq/L (ref 135–145)
Total Protein: 7.6 g/dL (ref 6.0–8.3)

## 2014-01-24 LAB — TSH: TSH: 0.47 u[IU]/mL (ref 0.35–4.50)

## 2014-01-24 NOTE — Progress Notes (Signed)
Pre visit review using our clinic review tool, if applicable. No additional management support is needed unless otherwise documented below in the visit note. 

## 2014-01-24 NOTE — Progress Notes (Signed)
HPI:  follow up weight loss: -pt presented 12/2013 with seignificant psychiatric complaints and reported when he has these spellls does not eat -advised getting labwork at the time, but he refused -advised he see a psychiatrist and a list of psychiatrist names and numbers provided -he reports: he reports he is not eating well due to his emotions, reports he drops significant (50-60 lbs in the past) during psychiatric illness -denies: fevers, blood in stools, nausea or vomiting, abd pain, CP, SOB, urinary symptoms, change in bowels, hematochezia or melena  Prediabetes/Obesity:  -advised lifestyle changes  -reports: not exercising, not eating well -denies: vision changes, polyuria, polydipsia, wounds   Hyperlipidemia:  -advised sig lifestyle changes and close follow up and he had made some changes -denies: skin changes   Resistant HTN/LVH, OSA:  -meds:norvasc 10, asa, irbesartan-hctz(avalide) 150-12.5, nebivolol  -managed by his cardiologist  -wears cpap  -denies: CP, sob, swelling, syncope   ED:  -followed by [facility]   Depression/OCD: -depressed mood, fear, checking windows and doors -did not go out of house for 1 week due - he reports he did not feel like doing anything -reports did not see a psychiatrist, reports just doesn't have motivation to do this and tried to set up appt with PMR instead of psychiatrist accidentally -continues to work -denies SI, HI   ROS: See pertinent positives and negatives per HPI.  Past Medical History  Diagnosis Date  . Hypertension     high blood pressure readings   . Heart palpitations   . Obstructive sleep apnea   . Obesity   . History of echocardiogram     (5/09) showed EF 50-55%, mild LV dilation, mild LV hypertrophy, pseudonormal  diastolic function, mild mitral regurgitation, and mild left atrial enlargement.;  Echo 8/14:  Mild LVH, EF 55-60%, Gr 2 DD, mild MR, mild LAE  . Mild mitral regurgitation by prior  echocardiogram   . Chicken pox   . GERD (gastroesophageal reflux disease)   . Asthma     childhood  . Perioperative myocardial infarction     Perioperative troponin elevation in 2007 after the patient's uvuloplasty.  He did have some chest pain at the time as well.  Left heart catheterization at that time showed normal coronary arteries and normal renal  arteries, EF was 50%.    Past Surgical History  Procedure Laterality Date  . Cardiac catheterization      EF of 50% with Normal coronary arteries, normal renal arteries    Family History  Problem Relation Age of Onset  . Hypertension Father     family history of early onset  . Hypertension Mother   . Diabetes Mother     History   Social History  . Marital Status: Single    Spouse Name: N/A    Number of Children: N/A  . Years of Education: N/A   Social History Main Topics  . Smoking status: Never Smoker   . Smokeless tobacco: None  . Alcohol Use: Yes     Comment: few drinks occ  . Drug Use: None  . Sexual Activity: None   Other Topics Concern  . 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  amLODipine (NORVASC) 10 MG tablet, TAKE 1 TABLET BY MOUTH EVERY DAY, Disp: 90 tablet, Rfl: 3 .  aspirin 81 MG tablet, Take 81 mg by mouth daily., Disp: , Rfl:  .  Citrulline 600 MG CAPS, Take 1,000 capsules by mouth 2 (two) times a week. , Disp: , Rfl:  .  irbesartan-hydrochlorothiazide (AVALIDE) 150-12.5 MG per tablet, TAKE 1 TABLET BY MOUTH DAILY., Disp: 90 tablet, Rfl: 3 .  nebivolol (BYSTOLIC) 5 MG tablet, TAKE 1 TABLET (5 MG TOTAL) BY MOUTH DAILY., Disp: 90 tablet, Rfl: 3 .  terazosin (HYTRIN) 5 MG capsule, , Disp: , Rfl:  .  sildenafil (VIAGRA) 50 MG tablet, Take 1 tablet (50 mg total) by mouth daily as needed  for erectile dysfunction., Disp: 10 tablet, Rfl: 1  EXAM:  Filed Vitals:   01/24/14 0803  BP: 110/72  Pulse: 55  Temp: 98 F (36.7 C)    Body mass index is 34.9 kg/(m^2).  GENERAL: vitals reviewed and listed above, alert, oriented, appears well hydrated and in no acute distress  HEENT: atraumatic, conjunttiva clear, no obvious abnormalities on inspection of external nose and ears  NECK: no obvious masses on inspection  LUNGS: clear to auscultation bilaterally, no wheezes, rales or rhonchi, good air movement  CV: HRRR, no peripheral edema  MS: moves all extremities without noticeable abnormality  PSYCH: pleasant and cooperative, flat affect, agitated, fidgeting  ASSESSMENT AND PLAN:  Discussed the following assessment and plan:  Loss of weight  Essential hypertension  Depression - Plan: Ambulatory referral to Psychiatry  Palpitations  Chest pain, unspecified chest pain type  -check cbc, cmp, tsh, hgba1c, lipids -advised to see GI - he prefers to start with labs for the weight loss, he reports has lost 50-60 lbs in the past with psychiatric spells -advised needs to see psychiatrist and emergency precoautions if worsening - advised my assistant to help him schedule appt with counselor and psychiatrist - she contacted several offices and found [facility] takes his insurance and information provided for prompt eval and patient agreed to [facility] -Patient advised to return or notify a doctor immediately if symptoms worsen or persist or new concerns arise.  There are no Patient Instructions on file for this visit.   Colin Benton R.

## 2014-05-15 ENCOUNTER — Ambulatory Visit (INDEPENDENT_AMBULATORY_CARE_PROVIDER_SITE_OTHER): Payer: 59 | Admitting: Family Medicine

## 2014-05-15 ENCOUNTER — Encounter: Payer: Self-pay | Admitting: Family Medicine

## 2014-05-15 VITALS — BP 108/80 | HR 61 | Temp 98.3°F | Ht 69.5 in | Wt 252.9 lb

## 2014-05-15 DIAGNOSIS — R739 Hyperglycemia, unspecified: Secondary | ICD-10-CM | POA: Diagnosis not present

## 2014-05-15 DIAGNOSIS — Z6836 Body mass index (BMI) 36.0-36.9, adult: Secondary | ICD-10-CM | POA: Diagnosis not present

## 2014-05-15 DIAGNOSIS — I10 Essential (primary) hypertension: Secondary | ICD-10-CM

## 2014-05-15 DIAGNOSIS — K121 Other forms of stomatitis: Secondary | ICD-10-CM | POA: Diagnosis not present

## 2014-05-15 DIAGNOSIS — F329 Major depressive disorder, single episode, unspecified: Secondary | ICD-10-CM

## 2014-05-15 DIAGNOSIS — F32A Depression, unspecified: Secondary | ICD-10-CM

## 2014-05-15 LAB — BASIC METABOLIC PANEL
BUN: 17 mg/dL (ref 6–23)
CHLORIDE: 101 meq/L (ref 96–112)
CO2: 30 mEq/L (ref 19–32)
CREATININE: 1.19 mg/dL (ref 0.40–1.50)
Calcium: 9.6 mg/dL (ref 8.4–10.5)
GFR: 84.87 mL/min (ref >60.00)
GLUCOSE: 89 mg/dL (ref 70–99)
Potassium: 4.1 mEq/L (ref 3.5–5.1)
Sodium: 136 mEq/L (ref 135–145)

## 2014-05-15 LAB — HEMOGLOBIN A1C: Hgb A1c MFr Bld: 6.1 % (ref 4.6–6.5)

## 2014-05-15 NOTE — Progress Notes (Signed)
HPI:  Bump in mouth: -started a few days ago, now improving -in upper lip - was sore -no other sore or lesions  -denies: trauma, recent illness, bleeding  Urinary Frequency: -started 1 week ago -he saw his urologist today about this  -but he worries about diabetes and wants to check labs -he has been checking his BS and average is 111, one postprandial was 171 -denies: blood in urine, penile discharge, pain with urination  Depression/weight loss: -reports symptoms resolved and he has regained weight -reports he did not see a psychiatrist -denies: depression, fear, poor appetite  ROS: See pertinent positives and negatives per HPI.  Past Medical History  Diagnosis Date  . Hypertension     high blood pressure readings   . Heart palpitations   . Obstructive sleep apnea   . Obesity   . History of echocardiogram     (5/09) showed EF 50-55%, mild LV dilation, mild LV hypertrophy, pseudonormal  diastolic function, mild mitral regurgitation, and mild left atrial enlargement.;  Echo 8/14:  Mild LVH, EF 55-60%, Gr 2 DD, mild MR, mild LAE  . Mild mitral regurgitation by prior echocardiogram   . Chicken pox   . GERD (gastroesophageal reflux disease)   . Asthma     childhood  . Perioperative myocardial infarction     Perioperative troponin elevation in 2007 after the patient's uvuloplasty.  He did have some chest pain at the time as well.  Left heart catheterization at that time showed normal coronary arteries and normal renal  arteries, EF was 50%.  . Anxiety and depression     looses weight with episodes, advised to see psychiatrist 01/2014    Past Surgical History  Procedure Laterality Date  . Cardiac catheterization      EF of 50% with Normal coronary arteries, normal renal arteries    Family History  Problem Relation Age of Onset  . Hypertension Father     family history of early onset  . Hypertension Mother   . Diabetes Mother     History   Social History  .  Marital Status: Single    Spouse Name: N/A  . Number of Children: N/A  . Years of Education: N/A   Social History Main Topics  . Smoking status: Never Smoker   . Smokeless tobacco: Not on file  . Alcohol Use: Yes     Comment: few drinks occ  . Drug Use: Not on file  . Sexual Activity: Not on file   Other Topics Concern  . 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  amLODipine (NORVASC) 10 MG tablet, TAKE 1 TABLET BY MOUTH EVERY DAY, Disp: 90 tablet, Rfl: 3 .  aspirin 81 MG tablet, Take 81 mg by mouth daily., Disp: , Rfl:  .  Citrulline 600 MG CAPS, Take 1,000 capsules by mouth 2 (two) times a week. , Disp: , Rfl:  .  irbesartan-hydrochlorothiazide (AVALIDE) 150-12.5 MG per tablet, TAKE 1 TABLET BY MOUTH DAILY., Disp: 90 tablet, Rfl: 3 .  nebivolol (BYSTOLIC) 5 MG tablet, TAKE 1 TABLET (5 MG TOTAL) BY MOUTH DAILY., Disp: 90 tablet, Rfl: 3 .  terazosin (HYTRIN) 10 MG capsule,  Take 10 mg by mouth daily., Disp: , Rfl:  .  sildenafil (VIAGRA) 50 MG tablet, Take 1 tablet (50 mg total) by mouth daily as needed for erectile dysfunction., Disp: 10 tablet, Rfl: 1  EXAM:  Filed Vitals:   05/15/14 1452  BP: 108/80  Pulse: 61  Temp: 98.3 F (36.8 C)    Body mass index is 36.82 kg/(m^2).  GENERAL: vitals reviewed and listed above, alert, oriented, appears well hydrated and in no acute distress  HEENT: atraumatic, conjunttiva clear, no obvious abnormalities on inspection of external nose and ears  NECK: no obvious masses on inspection  LUNGS: clear to auscultation bilaterally, no wheezes, rales or rhonchi, good air movement  CV: HRRR, no peripheral edema  MS: moves all extremities without noticeable abnormality  PSYCH: pleasant and cooperative, no obvious  depression or anxiety  ASSESSMENT AND PLAN:  Discussed the following assessment and plan:  Hyperglycemia - Plan: Hemoglobin A1c -recheck hgba1c, follow up with urology if symptoms persist  Oral ulcer -healing, advised oral rinse and follow up if persists  BMI 36.0-36.9,adult -lifestyle recs  Essential hypertension - Plan: Basic Metabolic Panel  Depression -advised CBT with Dr. Glennon Hamilton to help prevent further recurrences, number given to schedule appointment  -Patient advised to return or notify a doctor immediately if symptoms worsen or persist or new concerns arise.  Patient Instructions  FOR THE SORE LIP: -please use 1/2 water and 1/2 hydrogen peroxide to swish and spit 2 times daily for 3 days -if not resolved in 3 weeks please call  FOR THE URINARY SYMPTOMS: -we will check some labs today -please follow up with your urologist if these symptoms persist in 2 weeks or if the worsen     Brette Cast R.

## 2014-05-15 NOTE — Patient Instructions (Signed)
FOR THE SORE LIP: -please use 1/2 water and 1/2 hydrogen peroxide to swish and spit 2 times daily for 3 days -if not resolved in 3 weeks please call  FOR THE URINARY SYMPTOMS: -we will check some labs today -please follow up with your urologist if these symptoms persist in 2 weeks or if the worsen

## 2014-05-15 NOTE — Progress Notes (Signed)
Pre visit review using our clinic review tool, if applicable. No additional management support is needed unless otherwise documented below in the visit note. 

## 2014-09-06 ENCOUNTER — Encounter: Payer: Self-pay | Admitting: Family Medicine

## 2014-09-06 ENCOUNTER — Ambulatory Visit (INDEPENDENT_AMBULATORY_CARE_PROVIDER_SITE_OTHER): Payer: 59 | Admitting: Family Medicine

## 2014-09-06 VITALS — BP 136/76 | HR 69 | Temp 98.6°F | Ht 69.5 in | Wt 259.7 lb

## 2014-09-06 DIAGNOSIS — E785 Hyperlipidemia, unspecified: Secondary | ICD-10-CM

## 2014-09-06 DIAGNOSIS — E669 Obesity, unspecified: Secondary | ICD-10-CM

## 2014-09-06 DIAGNOSIS — R221 Localized swelling, mass and lump, neck: Secondary | ICD-10-CM | POA: Diagnosis not present

## 2014-09-06 DIAGNOSIS — I10 Essential (primary) hypertension: Secondary | ICD-10-CM

## 2014-09-06 NOTE — Progress Notes (Signed)
Pre visit review using our clinic review tool, if applicable. No additional management support is needed unless otherwise documented below in the visit note. 

## 2014-09-06 NOTE — Progress Notes (Signed)
HPI:  Acute visit for:  Lump on shoulder: -noticed a few weeks ago -superior to L clavicle, feel a little uncomfortable -he desires removal  Follow up:  Prediabetes/Obesity:  -advised lifestyle changes  -reports: doing some exercise and eating healthy -denies: vision changes, polyuria, polydipsia, wounds   Hyperlipidemia:  -resolved with lifestyle changes  Resistant HTN/LVH, OSA:  -meds:norvasc 10, asa, irbesartan-hctz(avalide) 150-12.5, nebivolol  -managed by his cardiologist  -wears cpap  -Settings are 18 mmg Hg  -denies: CP, sob, swelling, syncope   ED:  -followed by [facility]  -denies: priapism, LUTS worsening  ROS: See pertinent positives and negatives per HPI.  Past Medical History  Diagnosis Date  . Hypertension     high blood pressure readings   . Heart palpitations   . Obstructive sleep apnea   . Obesity   . History of echocardiogram     (5/09) showed EF 50-55%, mild LV dilation, mild LV hypertrophy, pseudonormal  diastolic function, mild mitral regurgitation, and mild left atrial enlargement.;  Echo 8/14:  Mild LVH, EF 55-60%, Gr 2 DD, mild MR, mild LAE  . Mild mitral regurgitation by prior echocardiogram   . Chicken pox   . GERD (gastroesophageal reflux disease)   . Asthma     childhood  . Perioperative myocardial infarction     Perioperative troponin elevation in 2007 after the patient's uvuloplasty.  He did have some chest pain at the time as well.  Left heart catheterization at that time showed normal coronary arteries and normal renal  arteries, EF was 50%.  . Anxiety and depression     looses weight with episodes, advised to see psychiatrist 01/2014    Past Surgical History  Procedure Laterality Date  . Cardiac catheterization      EF of 50% with Normal coronary arteries, normal renal arteries    Family History  Problem Relation Age of Onset  . Hypertension Father     family history of early onset  . Hypertension Mother    . Diabetes Mother     Social History   Social History  . Marital Status: Single    Spouse Name: N/A  . Number of Children: N/A  . Years of Education: N/A   Social History Main Topics  . Smoking status: Never Smoker   . Smokeless tobacco: None  . Alcohol Use: Yes     Comment: few drinks occ  . Drug Use: None  . Sexual Activity: Not Asked   Other Topics Concern  . 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  amLODipine (NORVASC) 10 MG tablet, TAKE 1 TABLET BY MOUTH EVERY DAY, Disp: 90 tablet, Rfl: 3 .  aspirin 81 MG tablet, Take 81 mg by mouth daily., Disp: , Rfl:  .  Citrulline 600 MG CAPS, Take 1,000 capsules by mouth 2 (two) times a week. , Disp: , Rfl:  .  irbesartan-hydrochlorothiazide (AVALIDE) 150-12.5 MG per tablet, TAKE 1 TABLET BY MOUTH DAILY., Disp: 90 tablet, Rfl: 3 .  nebivolol (BYSTOLIC) 5 MG tablet, TAKE 1 TABLET (5 MG TOTAL) BY MOUTH DAILY., Disp: 90 tablet, Rfl: 3 .  terazosin (HYTRIN) 10 MG capsule, Take 10 mg by mouth daily.,  Disp: , Rfl:  .  sildenafil (VIAGRA) 50 MG tablet, Take 1 tablet (50 mg total) by mouth daily as needed for erectile dysfunction., Disp: 10 tablet, Rfl: 1  EXAM:  Filed Vitals:   09/06/14 0906  BP: 136/76  Pulse: 69  Temp: 98.6 F (37 C)    Body mass index is 37.81 kg/(m^2).  GENERAL: vitals reviewed and listed above, alert, oriented, appears well hydrated and in no acute distress  HEENT: atraumatic, conjunttiva clear, no obvious abnormalities on inspection of external nose and ears  NECK: no obvious masses on inspection  LUNGS: clear to auscultation bilaterally, no wheezes, rales or rhonchi, good air movement  CV: HRRR, no peripheral edema  MS: moves all extremities without noticeable abnormality Soft mass  approx 2 cm in diameter just superior to lateral half of L clavicle  PSYCH: pleasant and cooperative, no obvious depression or anxiety  ASSESSMENT AND PLAN:  Discussed the following assessment and plan:  Mass of neck - Plan: US Soft Tissue Head/Neck  Essential hypertension  Obesity  Hyperlipemia  -will eval with [place] then refer per his request for removal -Patient advised to return or notify a doctor immediately if symptoms worsen or persist or new concerns arise.  Patient Instructions  -We placed a referral for you as discussed for the ultrasound. It usually takes about 1-2 weeks to process and schedule this referral. If you have not heard from [place] regarding this appointment in 2 weeks please contact our office.  Follow up in 3 months      KIM, [facility] R.

## 2014-09-06 NOTE — Patient Instructions (Addendum)
-  We placed a referral for you as discussed for the ultrasound. It usually takes about 1-2 weeks to process and schedule this referral. If you have not heard from [place] regarding this appointment in 2 weeks please contact our office.  Follow up in 3 months for annual physical exam - come fasting please

## 2014-09-11 ENCOUNTER — Ambulatory Visit
Admission: RE | Admit: 2014-09-11 | Discharge: 2014-09-11 | Disposition: A | Discharge status: Home / Self Care | Payer: 59 | Source: Ambulatory Visit | Attending: Family Medicine | Admitting: Family Medicine

## 2014-09-11 DIAGNOSIS — R221 Localized swelling, mass and lump, neck: Secondary | ICD-10-CM

## 2014-09-12 ENCOUNTER — Other Ambulatory Visit: Payer: Self-pay | Admitting: *Deleted

## 2014-09-12 DIAGNOSIS — D170 Benign lipomatous neoplasm of skin and subcutaneous tissue of head, face and neck: Secondary | ICD-10-CM

## 2014-10-04 ENCOUNTER — Other Ambulatory Visit: Payer: Self-pay | Admitting: Otolaryngology

## 2014-10-04 NOTE — H&P (Signed)
PREOPERATIVE H&P  Chief Complaint: left neck mass  HPI: Samuel Wiley is a 44 y.o. male who presents for evaluation of left neck mass that measures 3-4 cm in size and is consistent with a neck lipoma. He first noticed this about a year ago and it has gradually gotten larger. He feels a little pressure related to the mass but it's not painful.  Past Medical History  Diagnosis Date  . Hypertension     high blood pressure readings   . Heart palpitations   . Obstructive sleep apnea   . Obesity   . History of echocardiogram     (5/09) showed EF 50-55%, mild LV dilation, mild LV hypertrophy, pseudonormal  diastolic function, mild mitral regurgitation, and mild left atrial enlargement.;  Echo 8/14:  Mild LVH, EF 55-60%, Gr 2 DD, mild MR, mild LAE  . Mild mitral regurgitation by prior echocardiogram   . Chicken pox   . GERD (gastroesophageal reflux disease)   . Asthma     childhood  . Perioperative myocardial infarction     Perioperative troponin elevation in 2007 after the patient's uvuloplasty.  He did have some chest pain at the time as well.  Left heart catheterization at that time showed normal coronary arteries and normal renal  arteries, EF was 50%.  . Anxiety and depression     looses weight with episodes, advised to see psychiatrist 01/2014   Past Surgical History  Procedure Laterality Date  . Cardiac catheterization      EF of 50% with Normal coronary arteries, normal renal arteries   Social History   Social History  . Marital Status: Single    Spouse Name: N/A  . Number of Children: N/A  . Years of Education: N/A   Social History Main Topics  . Smoking status: Never Smoker   . Smokeless tobacco: Not on file  . Alcohol Use: Yes     Comment: few drinks occ  . Drug Use: Not on file  . Sexual Activity: Not on file   Other Topics Concern  . 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 Hypertension Father     family history of early onset  . Hypertension Mother   . Diabetes Mother    Allergies  Allergen Reactions  . Milk Protein    Prior to Admission medications   Medication Sig Start Date End Date Taking? Authorizing Provider  amLODipine (NORVASC) 10 MG tablet TAKE 1 TABLET BY MOUTH EVERY DAY 11/09/13   Lucretia Kern, DO  aspirin 81 MG tablet Take 81 mg by mouth daily.    Historical Provider, MD  Citrulline 600 MG CAPS Take 1,000 capsules by mouth 2 (two) times a week.     Historical Provider, MD  irbesartan-hydrochlorothiazide (AVALIDE) 150-12.5 MG per tablet TAKE 1 TABLET BY MOUTH DAILY. 11/09/13   Lucretia Kern, DO  nebivolol (BYSTOLIC) 5 MG tablet TAKE 1 TABLET (5 MG TOTAL) BY MOUTH DAILY. 11/09/13   Lucretia Kern, DO  sildenafil (VIAGRA) 50 MG tablet Take 1 tablet (50 mg total) by mouth daily as needed for erectile dysfunction. 08/08/12 11/20/13  Larey Dresser, MD  terazosin (HYTRIN) 10 MG capsule Take 10 mg by mouth daily.    Historical Provider, MD     Positive ROS: negative  All other systems have been reviewed and were otherwise negative with the exception of those mentioned in the HPI and as above.  Physical Exam: There were no vitals filed for this visit.  General: Alert, no acute distress Oral: Normal oral mucosa and tonsils Nasal: Clear nasal passages Neck: No palpable adenopathy or thyroid nodules, he has a 3-3.5 cm soft mass just anterior to the trap muscle on the left side of his neck Ear: Ear canal is clear with normal appearing TMs Cardiovascular: Regular rate and rhythm, no murmur.  Respiratory: Clear to auscultation Neurologic: Alert and oriented x 3   Assessment/Plan: LEFT NECK LIPOMA Plan for Procedure(s): EXCISION LEFT NECK LIPOMA   Melony Overly, MD 10/04/2014 5:06  PM

## 2014-10-05 ENCOUNTER — Encounter (HOSPITAL_BASED_OUTPATIENT_CLINIC_OR_DEPARTMENT_OTHER): Payer: Self-pay | Admitting: *Deleted

## 2014-10-08 ENCOUNTER — Other Ambulatory Visit: Payer: Self-pay | Admitting: Otolaryngology

## 2014-10-08 NOTE — Interval H&P Note (Signed)
History and Physical Interval Note:  10/08/2014 5:02 PM  Samuel Wiley  has presented today for surgery, with the diagnosis of LEFT NECK LIPOMA  The various methods of treatment have been discussed with the patient and family. After consideration of risks, benefits and other options for treatment, the patient has consented to  Procedure(s): EXCISION LEFT NECK LIPOMA (Left) as a surgical intervention .  The patient's history has been reviewed, patient examined, no change in status, stable for surgery.  I have reviewed the patient's chart and labs.  Questions were answered to the patient's satisfaction.     NEWMAN, CHRISTOPHER

## 2014-10-09 ENCOUNTER — Encounter (HOSPITAL_BASED_OUTPATIENT_CLINIC_OR_DEPARTMENT_OTHER)
Admission: RE | Disposition: A | Discharge status: Home / Self Care | Payer: Self-pay | Source: Ambulatory Visit | Attending: Otolaryngology

## 2014-10-09 ENCOUNTER — Encounter (HOSPITAL_BASED_OUTPATIENT_CLINIC_OR_DEPARTMENT_OTHER): Payer: Self-pay | Admitting: Anesthesiology

## 2014-10-09 ENCOUNTER — Encounter (HOSPITAL_BASED_OUTPATIENT_CLINIC_OR_DEPARTMENT_OTHER): Payer: Self-pay | Admitting: *Deleted

## 2014-10-09 ENCOUNTER — Ambulatory Visit (HOSPITAL_BASED_OUTPATIENT_CLINIC_OR_DEPARTMENT_OTHER)
Admission: RE | Admit: 2014-10-09 | Discharge: 2014-10-09 | Disposition: A | Discharge status: Home / Self Care | Payer: 59 | Source: Ambulatory Visit | Attending: Otolaryngology | Admitting: Otolaryngology

## 2014-10-09 DIAGNOSIS — Z7982 Long term (current) use of aspirin: Secondary | ICD-10-CM | POA: Diagnosis not present

## 2014-10-09 DIAGNOSIS — I10 Essential (primary) hypertension: Secondary | ICD-10-CM | POA: Diagnosis not present

## 2014-10-09 DIAGNOSIS — G4733 Obstructive sleep apnea (adult) (pediatric): Secondary | ICD-10-CM | POA: Insufficient documentation

## 2014-10-09 DIAGNOSIS — D170 Benign lipomatous neoplasm of skin and subcutaneous tissue of head, face and neck: Secondary | ICD-10-CM | POA: Diagnosis not present

## 2014-10-09 DIAGNOSIS — I252 Old myocardial infarction: Secondary | ICD-10-CM | POA: Diagnosis not present

## 2014-10-09 HISTORY — PX: MASS EXCISION: SHX2000

## 2014-10-09 SURGERY — MINOR EXCISION OF MASS
Anesthesia: LOCAL | Site: Neck | Laterality: Left

## 2014-10-09 MED ORDER — LIDOCAINE-EPINEPHRINE 1 %-1:100000 IJ SOLN
INTRAMUSCULAR | Status: DC | PRN
  Administered 2014-10-09: 5 mL

## 2014-10-09 MED ORDER — PROMETHAZINE HCL 25 MG/ML IJ SOLN: 6.2500 mg | INTRAMUSCULAR | Status: DC | PRN

## 2014-10-09 MED ORDER — BACITRACIN ZINC 500 UNIT/GM EX OINT
TOPICAL_OINTMENT | CUTANEOUS | Status: AC
  Filled 2014-10-09: qty 1.8

## 2014-10-09 MED ORDER — HYDROMORPHONE HCL 1 MG/ML IJ SOLN
0.2500 mg | INTRAMUSCULAR | Status: DC | PRN
Start: 2014-10-09 — End: 2014-10-09

## 2014-10-09 MED ORDER — LIDOCAINE-EPINEPHRINE 1 %-1:100000 IJ SOLN
INTRAMUSCULAR | Status: AC
  Filled 2014-10-09: qty 1

## 2014-10-09 SURGICAL SUPPLY — 53 items
APL SKNCLS STERI-STRIP NONHPOA (GAUZE/BANDAGES/DRESSINGS)
BALL CTTN LRG ABS STRL LF (GAUZE/BANDAGES/DRESSINGS)
BANDAGE ADH SHEER 1  50/CT (GAUZE/BANDAGES/DRESSINGS) IMPLANT
BENZOIN TINCTURE PRP APPL 2/3 (GAUZE/BANDAGES/DRESSINGS) IMPLANT
BLADE SURG 15 STRL LF DISP TIS (BLADE) ×1 IMPLANT
BLADE SURG 15 STRL SS (BLADE) ×2
CANISTER SUCT 1200ML W/VALVE (MISCELLANEOUS) IMPLANT
CAUTERY EYE LOW TEMP 1300F FIN (OPHTHALMIC RELATED) IMPLANT
CLEANER CAUTERY TIP 5X5 PAD (MISCELLANEOUS) IMPLANT
COTTONBALL LRG STERILE PKG (GAUZE/BANDAGES/DRESSINGS) IMPLANT
DECANTER SPIKE VIAL GLASS SM (MISCELLANEOUS) IMPLANT
DRSG TEGADERM 4X4.75 (GAUZE/BANDAGES/DRESSINGS) IMPLANT
ELECT COATED BLADE 2.86 ST (ELECTRODE) ×2 IMPLANT
ELECT REM PT RETURN 9FT ADLT (ELECTROSURGICAL) ×2
ELECTRODE REM PT RTRN 9FT ADLT (ELECTROSURGICAL) ×1 IMPLANT
GAUZE SPONGE 4X4 16PLY XRAY LF (GAUZE/BANDAGES/DRESSINGS) IMPLANT
GLOVE SS BIOGEL STRL SZ 7.5 (GLOVE) ×1 IMPLANT
GLOVE SUPERSENSE BIOGEL SZ 7.5 (GLOVE) ×1
GLOVE SURG SS PI 7.0 STRL IVOR (GLOVE) ×1 IMPLANT
GOWN STRL REUS W/ TWL LRG LVL3 (GOWN DISPOSABLE) IMPLANT
GOWN STRL REUS W/TWL LRG LVL3 (GOWN DISPOSABLE) ×4
LIQUID BAND (GAUZE/BANDAGES/DRESSINGS) ×1 IMPLANT
MARKER SKIN DUAL TIP RULER LAB (MISCELLANEOUS) IMPLANT
NDL PRECISIONGLIDE 27X1.5 (NEEDLE) ×1 IMPLANT
NEEDLE PRECISIONGLIDE 27X1.5 (NEEDLE) ×2 IMPLANT
NS IRRIG 1000ML POUR BTL (IV SOLUTION) ×1 IMPLANT
PACK BASIN DAY SURGERY FS (CUSTOM PROCEDURE TRAY) ×1 IMPLANT
PAD CLEANER CAUTERY TIP 5X5 (MISCELLANEOUS)
PENCIL BUTTON HOLSTER BLD 10FT (ELECTRODE) ×2 IMPLANT
SPONGE GAUZE 4X4 12PLY STER LF (GAUZE/BANDAGES/DRESSINGS) IMPLANT
SPONGE INTESTINAL PEANUT (DISPOSABLE) IMPLANT
STRIP CLOSURE SKIN 1/2X4 (GAUZE/BANDAGES/DRESSINGS) IMPLANT
STRIP CLOSURE SKIN 1/4X4 (GAUZE/BANDAGES/DRESSINGS) IMPLANT
SUCTION FRAZIER TIP 10 FR DISP (SUCTIONS) IMPLANT
SUT CHROMIC 3 0 PS 2 (SUTURE) IMPLANT
SUT CHROMIC 3 0 SH 27 (SUTURE) ×1 IMPLANT
SUT CHROMIC 4 0 P 3 18 (SUTURE) IMPLANT
SUT ETHILON 5 0 P 3 18 (SUTURE)
SUT ETHILON 6 0 P 1 (SUTURE) IMPLANT
SUT NYLON ETHILON 5-0 P-3 1X18 (SUTURE) IMPLANT
SUT PLAIN 5 0 P 3 18 (SUTURE) IMPLANT
SUT SILK 4 0 TIES 17X18 (SUTURE) IMPLANT
SUT VIC AB 4-0 P-3 18XBRD (SUTURE) IMPLANT
SUT VIC AB 4-0 P3 18 (SUTURE)
SWAB COLLECTION DEVICE MRSA (MISCELLANEOUS) IMPLANT
SWABSTICK POVIDONE IODINE SNGL (MISCELLANEOUS) ×2 IMPLANT
SYR BULB 3OZ (MISCELLANEOUS) IMPLANT
SYR CONTROL 10ML LL (SYRINGE) ×2 IMPLANT
TOWEL OR 17X24 6PK STRL BLUE (TOWEL DISPOSABLE) ×2 IMPLANT
TRAY DSU PREP LF (CUSTOM PROCEDURE TRAY) ×1 IMPLANT
TUBE ANAEROBIC SPECIMEN COL (MISCELLANEOUS) IMPLANT
TUBE CONNECTING 20X1/4 (TUBING) IMPLANT
YANKAUER SUCT BULB TIP NO VENT (SUCTIONS) IMPLANT

## 2014-10-09 NOTE — Discharge Instructions (Signed)
Keep incision site dry for 24 hrs Apply cool compress to area if you have any swelling Tylenol or motrin prn pain Avoid any heavy lifting or stress to the incision site for the next week Call Dr Pollie Friar office for follow up appt in 1 week   [phone]

## 2014-10-09 NOTE — Brief Op Note (Signed)
10/09/2014  8:28 AM  PATIENT:  Samuel Wiley  44 y.o. male  PRE-OPERATIVE DIAGNOSIS:  LEFT NECK LIPOMA  POST-OPERATIVE DIAGNOSIS:  LEFT NECK LIPOMA  PROCEDURE:  Procedure(s): EXCISION LEFT NECK LIPOMA (Left)  SURGEON:  Surgeon(s) and Role:    * Rozetta Nunnery, MD - Primary  PHYSICIAN ASSISTANT:   ASSISTANTS: none   ANESTHESIA:   local  EBL:     BLOOD ADMINISTERED:none  DRAINS: none   LOCAL MEDICATIONS USED:  XYLOCAINE with EPI 5 cc  SPECIMEN:  Source of Specimen:  left neck mass  DISPOSITION OF SPECIMEN:  PATHOLOGY  COUNTS:  YES  TOURNIQUET:  * No tourniquets in log *  DICTATION: .Other Dictation: Dictation Number [id number]  PLAN OF CARE: Discharge to home after PACU  PATIENT DISPOSITION:  PACU - hemodynamically stable.   Delay start of Pharmacological VTE agent (>24hrs) due to surgical blood loss or risk of bleeding: not applicable

## 2014-10-09 NOTE — Anesthesia Preprocedure Evaluation (Deleted)
Anesthesia Evaluation    History of Anesthesia Complications Negative for: history of anesthetic complications  Airway        Dental   Pulmonary           Cardiovascular      Neuro/Psych negative neurological ROS     GI/Hepatic Neg liver ROS, GERD  Medicated,  Endo/Other    Renal/GU negative Renal ROS     Musculoskeletal   Abdominal   Peds  Hematology   Anesthesia Other Findings   Reproductive/Obstetrics                            Anesthesia Physical Anesthesia Plan  ASA:   Anesthesia Plan:    Post-op Pain Management:    Induction:   Airway Management Planned:   Additional Equipment:   Intra-op Plan:   Post-operative Plan:   Informed Consent:   Plan Discussed with:   Anesthesia Plan Comments:        Anesthesia Quick Evaluation

## 2014-10-09 NOTE — Interval H&P Note (Signed)
History and Physical Interval Note:  10/09/2014 7:37 AM  Samuel Wiley  has presented today for surgery, with the diagnosis of LEFT NECK LIPOMA  The various methods of treatment have been discussed with the patient and family. After consideration of risks, benefits and other options for treatment, the patient has consented to  Procedure(s): EXCISION LEFT NECK LIPOMA (Left) as a surgical intervention .  The patient's history has been reviewed, patient examined, no change in status, stable for surgery.  I have reviewed the patient's chart and labs.  Questions were answered to the patient's satisfaction.     NEWMAN, CHRISTOPHER

## 2014-10-10 ENCOUNTER — Encounter (HOSPITAL_BASED_OUTPATIENT_CLINIC_OR_DEPARTMENT_OTHER): Payer: Self-pay | Admitting: Otolaryngology

## 2014-10-10 NOTE — Op Note (Signed)
Samuel Wiley, Samuel Wiley                ACCOUNT NO.:  [id number]  MEDICAL RECORD NO.:  [id number]  LOCATION:                               FACILITY:  [facility]  PHYSICIAN:  Leonides Sake. Lucia Gaskins, M.D.DATE OF BIRTH:  1970-02-29  DATE OF PROCEDURE:  10/09/2014 DATE OF DISCHARGE:  10/09/2014                              OPERATIVE REPORT   PREOPERATIVE DIAGNOSIS:  Left neck mass consistent with a left neck lipoma.  POSTOPERATIVE DIAGNOSIS:  Left neck mass consistent with a left neck lipoma.  OPERATION PERFORMED:  Excisional biopsy of the left neck mass. (3-4 cm size)  SURGEON:  Christopher E. Lucia Gaskins, M.D.  ANESTHESIA:  Local 1% Xylocaine with 1:100,000 epinephrine 5 mL.  COMPLICATIONS:  None.  BRIEF CLINICAL NOTE:  The patient is a 44 year old gentleman.  He has noticed an enlarging left neck mass now for about a year.  It occasionally causes pressure.  He had underwent a CT scan which shows a 3 x 3.5 cm soft tissue mass in the left neck consistent with a probable lipoma, just anterior to the trapezius muscle.  On exam, he has a soft palpable mass in the lower left neck just anterior to trapezius muscle, measures about 3 x 3.5 cm in size.  He is taken to the operating room at this time for excision under local anesthetic.  DESCRIPTION OF PROCEDURE:  The patient was brought to the operating room, placed in the supine position.  The left neck was prepped with Betadine solution and draped out with sterile towels.  The mass was marked out, and the area was injected with 5 mL of 1% Xylocaine with epinephrine.  A horizontal incision was made directly over the mass. Dissection was carried down through the dermis to the subcutaneous tissue, and a lipoma was encountered.  The lipoma was dissected off around its periphery with loose connections to the surrounding tissue. It was dissected off from the deep musculature.  Specimen was sent to Pathology.  Hemostasis was obtained with a cautery, and  the defect was closed with 3-0 chromic sutures subcutaneously and a Dermabond to reapproximate the skin edges.  The patient tolerated this well.  He was discharged to home later this morning on Tylenol, Motrin p.r.n. pain. He will follow up in my office in one week for recheck and review of final pathology.         ______________________________ Leonides Sake Lucia Gaskins, M.D.    CEN/MEDQ  D:  10/09/2014  T:  10/10/2014  Job:  [id number]  cc:   Dr. Colin Benton

## 2014-11-14 ENCOUNTER — Other Ambulatory Visit: Payer: Self-pay | Admitting: Family Medicine

## 2014-12-11 ENCOUNTER — Ambulatory Visit: Payer: 59 | Admitting: Family Medicine

## 2015-11-18 ENCOUNTER — Encounter: Payer: Self-pay | Admitting: Family Medicine

## 2015-11-18 ENCOUNTER — Ambulatory Visit (INDEPENDENT_AMBULATORY_CARE_PROVIDER_SITE_OTHER): Payer: 59 | Admitting: Family Medicine

## 2015-11-18 VITALS — BP 120/80 | HR 58 | Temp 98.5°F | Ht 69.5 in | Wt 268.6 lb

## 2015-11-18 DIAGNOSIS — Z6839 Body mass index (BMI) 39.0-39.9, adult: Secondary | ICD-10-CM | POA: Diagnosis not present

## 2015-11-18 DIAGNOSIS — Z0000 Encounter for general adult medical examination without abnormal findings: Secondary | ICD-10-CM | POA: Diagnosis not present

## 2015-11-18 DIAGNOSIS — I10 Essential (primary) hypertension: Secondary | ICD-10-CM

## 2015-11-18 LAB — BASIC METABOLIC PANEL
BUN: 15 mg/dL (ref 6–23)
CALCIUM: 9.9 mg/dL (ref 8.4–10.5)
CO2: 29 mEq/L (ref 19–32)
Chloride: 102 mEq/L (ref 96–112)
Creatinine, Ser: 1.14 mg/dL (ref 0.40–1.50)
GFR: 88.59 mL/min (ref >60.00)
GLUCOSE: 87 mg/dL (ref 70–99)
POTASSIUM: 4 meq/L (ref 3.5–5.1)
SODIUM: 138 meq/L (ref 135–145)

## 2015-11-18 LAB — CBC
HEMATOCRIT: 43.5 % (ref 39.0–52.0)
Hemoglobin: 14.2 g/dL (ref 13.0–17.0)
MCHC: 32.7 g/dL (ref 30.0–36.0)
MCV: 81.6 fl (ref 78.0–100.0)
Platelets: 209 10*3/uL (ref 150.0–400.0)
RBC: 5.33 Mil/uL (ref 4.22–5.81)
RDW: 13.9 % (ref 11.5–15.5)
WBC: 5.1 10*3/uL (ref 4.0–10.5)

## 2015-11-18 LAB — LIPID PANEL
CHOL/HDL RATIO: 3
Cholesterol: 142 mg/dL (ref 0–200)
HDL: 48.5 mg/dL (ref >39.00)
LDL CALC: 84 mg/dL (ref 0–99)
NONHDL: 93.18
TRIGLYCERIDES: 44 mg/dL (ref 0.0–149.0)
VLDL: 8.8 mg/dL (ref 0.0–40.0)

## 2015-11-18 LAB — HEMOGLOBIN A1C: Hgb A1c MFr Bld: 6.2 % (ref 4.6–6.5)

## 2015-11-18 NOTE — Patient Instructions (Signed)
BEFORE YOU LEAVE: -labs -follow up: 4 months  We have ordered labs or studies at this visit. It can take up to 1-2 weeks for results and processing. IF results require follow up or explanation, we will call you with instructions. Clinically stable results will be released to your [facility]. If you have not heard from [place] or cannot find your results in [facility] in 2 weeks please contact our office at [phone].  If you are not yet signed up for [facility], please consider signing up.   We recommend the following healthy lifestyle for LIFE: 1) Small portions.   Tip: eat off of a salad plate instead of a dinner plate.  Tip: It is ok to feel hungry after a meal if you had proper portion sizes  Tip: if you need more or a snack choose fruits, veggies and/or a handful of nuts or seeds.  2) Eat a healthy clean diet.  * Tip: Avoid (less then 1 serving per week): processed foods, sweets, sweetened drinks, white starches (rice, flour, bread, potatoes, pasta, etc), red meat, fast foods, butter  *Tip: CHOOSE instead   * 5-9 servings per day of fresh or frozen fruits and vegetables (but not corn, potatoes, bananas, canned or dried fruit)   *nuts and seeds, beans   *olives and olive oil   *small portions of lean meats such as fish and white chicken    *small portions of whole grains  3)Get at least 150 minutes of sweaty aerobic exercise per week.  4)Reduce stress - consider counseling, meditation and relaxation to balance other aspects of your life.

## 2015-11-18 NOTE — Progress Notes (Signed)
HPI:  Here for CPE:  -Concerns and/or follow up today:   PMH significant for obesity, hyperglycemia, depression, hypertension, hyperlipidemia. Takes several antihypertensives. Saw his urologist earlier this year and had a prostate exam. Has some itchy dry skin and sometime in pubic area has some dry skin with hair loss.   -Diet: "not good"  -Exercise: no regular exercise  -Diabetes and Dyslipidemia Screening: fasting for labs  -Vaccines: UTD  -wants STI testing, Hep C screening (if born 17-1967): wants hiv and rpr tests  -FH colon or prstate ca: see FH Last colon cancer screening: n/a Last prostate ca screening: saw Dr. Tammi Klippel earlier this year for prostate cancer screening  -Alcohol, Tobacco, drug use: see social history  Review of Systems - no fevers, unintentional weight loss, vision loss, hearing loss, chest pain, sob, hemoptysis, melena, hematochezia, hematuria, genital discharge, changing or concerning skin lesions, bleeding, bruising, loc, thoughts of self harm or SI  Past Medical History:  Diagnosis Date  . Anxiety and depression    looses weight with episodes, advised to see psychiatrist 01/2014  . Asthma    childhood  . Chicken pox   . GERD (gastroesophageal reflux disease)   . Heart palpitations   . History of echocardiogram    (5/09) showed EF 50-55%, mild LV dilation, mild LV hypertrophy, pseudonormal  diastolic function, mild mitral regurgitation, and mild left atrial enlargement.;  Echo 8/14:  Mild LVH, EF 55-60%, Gr 2 DD, mild MR, mild LAE  . Hypertension    high blood pressure readings   . Mild mitral regurgitation by prior echocardiogram   . Obesity   . Obstructive sleep apnea   . Perioperative myocardial infarction    Perioperative troponin elevation in 2007 after the patient's uvuloplasty.  He did have some chest pain at the time as well.  Left heart catheterization at that time showed normal coronary arteries and normal renal  arteries, EF was 50%.     Past Surgical History:  Procedure Laterality Date  . CARDIAC CATHETERIZATION     EF of 50% with Normal coronary arteries, normal renal arteries  . MASS EXCISION Left 10/09/2014   Procedure: EXCISION LEFT NECK LIPOMA;  Surgeon: Rozetta Nunnery, MD;  Location: [place];  Service: ENT;  Laterality: Left;    Family History  Problem Relation Age of Onset  . Hypertension Father     family history of early onset  . Hypertension Mother   . Diabetes Mother     Social History   Social History  . Marital status: Single    Spouse name: N/A  . Number of children: N/A  . Years of education: N/A   Social History Main Topics  . Smoking status: Never Smoker  . Smokeless tobacco: None  . Alcohol use Yes     Comment: few drinks occ  . Drug use: Unknown  . Sexual activity: Not Asked   Other Topics Concern  . 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  amLODipine (NORVASC) 10 MG tablet, TAKE 1 TABLET BY MOUTH EVERY DAY, Disp: 90 tablet, Rfl: 3 .  aspirin 81 MG tablet, Take 81 mg by mouth daily., Disp: , Rfl:  .  BYSTOLIC 5 MG tablet, TAKE 1 TABLET BY MOUTH EVERY DAY, Disp: 90 tablet, Rfl: 3 .  Citrulline 600 MG CAPS, Take 1,000 capsules by mouth 2 (two) times a week. , Disp: , Rfl:  .  hydrochlorothiazide (MICROZIDE) 12.5 MG capsule, TAKE ONE CAPSULE BY MOUTH EVERY DAY, Disp: 90 capsule, Rfl: 3 .  irbesartan (AVAPRO) 150 MG tablet, TAKE 1 TABLET BY MOUTH EVERY DAY, Disp: 90 tablet, Rfl: 3 .  terazosin (HYTRIN) 10 MG capsule, Take 10 mg by mouth daily., Disp: , Rfl:   EXAM:  Vitals:   11/18/15 1311  BP: 120/80  Pulse: (!) 58  Temp: 98.5 F (36.9 C)  TempSrc: Oral  Weight: 268 lb 9.6 oz (121.8 kg)  Height: 5' 9.5" (1.765 m)    Estimated  body mass index is 39.1 kg/m as calculated from the following:   Height as of this encounter: 5' 9.5" (1.765 m).   Weight as of this encounter: 268 lb 9.6 oz (121.8 kg).  GENERAL: vitals reviewed and listed below, alert, oriented, appears well hydrated and in no acute distress  HEENT: head atraumatic, PERRLA, normal appearance of eyes, ears, nose and mouth. moist mucus membranes.  NECK: supple, no masses or lymphadenopathy  LUNGS: clear to auscultation bilaterally, no rales, rhonchi or wheeze  CV: HRRR, no peripheral edema or cyanosis, normal pedal pulses  ABDOMEN: bowel sounds normal, soft, non tender to palpation, no masses, no rebound or guarding  GU: normal appearance of external genitalia - no lesions or masses. I do not appreciate any abnormal skin lesions or hair loss.  SKIN: no rash or abnormal lesions  MS: normal gait, moves all extremities normally  NEURO: normal gait, speech and thought processing grossly intact, muscle tone grossly intact throughout  PSYCH: normal affect, pleasant and cooperative  ASSESSMENT AND PLAN:  Discussed the following assessment and plan:  Encounter for preventive health examination - Plan: HIV antibody (with reflex), RPR, Lipid Panel, Hemoglobin A1c  Essential hypertension - Plan: CBC (no diff), Basic metabolic panel  BMI [id number]   -Discussed and advised all [place] preventive services health task force level A and B recommendations for age, sex and risks.  --Advised at least 150 minutes of exercise per week and a healthy diet with avoidance of (less then 1 serving per week) processed foods, white starches, red meat, fast foods and sweets and consisting of: * 5-9 servings of fresh fruits and vegetables (not corn or potatoes) *nuts and seeds, beans *olives and olive oil *lean meats such as fish and white chicken  *whole grains  -FASTING labs, studies and vaccines per orders this encounter   Patient advised to return to clinic  immediately if symptoms worsen or persist or new concerns.  Patient Instructions  BEFORE YOU LEAVE: -labs -follow up: 4 months  We have ordered labs or studies at this visit. It can take up to 1-2 weeks for results and processing. IF results require follow up or explanation, we will call you with instructions. Clinically stable results will be released to your [facility]. If you have not heard from [place] or cannot find your results in [facility] in 2 weeks please contact our office at [phone].  If you are not yet signed up for [facility], please consider signing up.   We recommend the following healthy lifestyle for LIFE: 1) Small portions.   Tip: eat off of a salad plate instead of a dinner plate.  Tip: It is ok to feel hungry after a meal if you had  proper portion sizes  Tip: if you need more or a snack choose fruits, veggies and/or a handful of nuts or seeds.  2) Eat a healthy clean diet.  * Tip: Avoid (less then 1 serving per week): processed foods, sweets, sweetened drinks, white starches (rice, flour, bread, potatoes, pasta, etc), red meat, fast foods, butter  *Tip: CHOOSE instead   * 5-9 servings per day of fresh or frozen fruits and vegetables (but not corn, potatoes, bananas, canned or dried fruit)   *nuts and seeds, beans   *olives and olive oil   *small portions of lean meats such as fish and white chicken    *small portions of whole grains  3)Get at least 150 minutes of sweaty aerobic exercise per week.  4)Reduce stress - consider counseling, meditation and relaxation to balance other aspects of your life.          No Follow-up on file.   Colin Benton R., DO

## 2015-11-18 NOTE — Progress Notes (Signed)
Pre visit review using our clinic review tool, if applicable. No additional management support is needed unless otherwise documented below in the visit note. 

## 2015-11-19 LAB — RPR

## 2015-11-19 LAB — HIV ANTIBODY (ROUTINE TESTING W REFLEX): HIV 1&2 Ab, 4th Generation: NONREACTIVE

## 2015-11-23 ENCOUNTER — Other Ambulatory Visit: Payer: Self-pay | Admitting: Family Medicine

## 2015-11-28 ENCOUNTER — Ambulatory Visit (INDEPENDENT_AMBULATORY_CARE_PROVIDER_SITE_OTHER): Payer: 59 | Admitting: Cardiovascular Disease

## 2015-11-28 ENCOUNTER — Encounter: Payer: Self-pay | Admitting: Cardiovascular Disease

## 2015-11-28 VITALS — BP 130/72 | HR 54 | Ht 70.0 in | Wt 270.0 lb

## 2015-11-28 DIAGNOSIS — I519 Heart disease, unspecified: Secondary | ICD-10-CM

## 2015-11-28 DIAGNOSIS — G4733 Obstructive sleep apnea (adult) (pediatric): Secondary | ICD-10-CM

## 2015-11-28 DIAGNOSIS — I119 Hypertensive heart disease without heart failure: Secondary | ICD-10-CM | POA: Diagnosis not present

## 2015-11-28 DIAGNOSIS — R079 Chest pain, unspecified: Secondary | ICD-10-CM | POA: Diagnosis not present

## 2015-11-28 DIAGNOSIS — E6609 Other obesity due to excess calories: Secondary | ICD-10-CM

## 2015-11-28 DIAGNOSIS — I5189 Other ill-defined heart diseases: Secondary | ICD-10-CM

## 2015-11-28 DIAGNOSIS — Z9989 Dependence on other enabling machines and devices: Secondary | ICD-10-CM

## 2015-11-28 NOTE — Patient Instructions (Signed)
Your physician has requested that you have en exercise stress myoview. You are not to take your Bystolic the day before the test nor the day of the test.   Your physician wants you to follow-up in: 6 months or sooner if needed. You will receive a reminder letter in the mail two months in advance. If you don't receive a letter, please call our office to schedule the follow-up appointment.  If you need a refill on your cardiac medications before your next appointment, please call your pharmacy.

## 2015-11-28 NOTE — Progress Notes (Signed)
Cardiology Office Note   Date:  11/30/2015   ID:  [name], [name] 1970/12/06, MRN [id number]  PCP:  Lucretia Kern., DO  Cardiologist:   Skeet Latch, MD   Chief Complaint  Patient presents with  . New Evaluation    pt c/o chest pain and numbness in fingers       History of Present Illness: Samuel Wiley is a 45 y.o. male with hypertension, grade 2 diastolic dysfunction, obstructive sleep apnea on CPAP, mild mitral regurgitation, GERD, anxiety and depression who presents for an evaluation of chest pain.  [name] notes occasional episodes of pain uner his left breast and arm.  This has occurred off and on for the last year.  It occurs typically at rest and never with exertion.  The pain is sharp and lasts between 10-45 minutes.  There is no associated shortness of breath, nausea or diaphoresis.  It is 4-5/10 in severity.  Although he doesn't have shortness of breath with the chest pain, he does note that he gets short of breath with minimal exertion.  He denies orthopnea, PND or lower extremity edema.  In 2007 he had elevated troponins after a uvuloplasty. He did have chest pain at the time. Subsequent left heart catheterization showed normal coronary arteries and his ejection fraction was 50%.   [name] notes episodes of numbness in the fingers on his left arm.  This occurs off and on for the last 2 years.  His thumb is not involved but he thinks that the other four fingers are.  [name] does not get any formal exercise but is fairly active at work.  He notes that his blood pressure is typically well-controlled.  He doesn't always take his HCTZ because he has noted that it causes chest pain.    Past Medical History:  Diagnosis Date  . Anxiety and depression    looses weight with episodes, advised to see psychiatrist 01/2014  . Asthma    childhood  . Chicken pox   . Diastolic dysfunction 123456   Grade 2 diastolic dysfunction on echo 08/2012.  Marland Kitchen GERD  (gastroesophageal reflux disease)   . Heart palpitations   . History of echocardiogram    (5/09) showed EF 50-55%, mild LV dilation, mild LV hypertrophy, pseudonormal  diastolic function, mild mitral regurgitation, and mild left atrial enlargement.;  Echo 8/14:  Mild LVH, EF 55-60%, Gr 2 DD, mild MR, mild LAE  . Hypertension    high blood pressure readings   . Mild mitral regurgitation by prior echocardiogram   . Obesity   . Obstructive sleep apnea   . Perioperative myocardial infarction    Perioperative troponin elevation in 2007 after the patient's uvuloplasty.  He did have some chest pain at the time as well.  Left heart catheterization at that time showed normal coronary arteries and normal renal  arteries, EF was 50%.    Past Surgical History:  Procedure Laterality Date  . CARDIAC CATHETERIZATION     EF of 50% with Normal coronary arteries, normal renal arteries  . MASS EXCISION Left 10/09/2014   Procedure: EXCISION LEFT NECK LIPOMA;  Surgeon: Rozetta Nunnery, MD;  Location: [place];  Service: ENT;  Laterality: Left;     Current Outpatient Prescriptions  Medication Sig Dispense Refill  . amLODipine (NORVASC) 10 MG tablet TAKE 1 TABLET BY MOUTH EVERY DAY 90 tablet 0  . arginine 500 MG tablet Take 1,000 mg by mouth 3 (three)  times daily.     Marland Kitchen aspirin 81 MG tablet Take 81 mg by mouth daily.    Marland Kitchen BYSTOLIC 5 MG tablet TAKE 1 TABLET BY MOUTH EVERY DAY 90 tablet 0  . Citrulline 600 MG CAPS Take 1,000 capsules by mouth 2 (two) times a week.     . Coenzyme Q10 (COQ10 PO) Take 1 capsule by mouth daily.    . Coenzyme Q10 (COQ10) 100 MG CAPS Take 1 capsule by mouth daily.    . hydrochlorothiazide (MICROZIDE) 12.5 MG capsule TAKE ONE CAPSULE BY MOUTH EVERY DAY 90 capsule 0  . irbesartan (AVAPRO) 150 MG tablet TAKE 1 TABLET BY MOUTH EVERY DAY 90 tablet 0  . Sildenafil Citrate (VIAGRA PO) Take by mouth as needed.    . terazosin (HYTRIN) 10 MG capsule Take 10 mg by mouth  daily.     No current facility-administered medications for this visit.     Allergies:   Milk protein and Clavulanic acid    Social History:  The patient  reports that he has never smoked. He does not have any smokeless tobacco history on file. He reports that he drinks alcohol.   Family History:  The patient's family history includes Diabetes in his mother; Hypertension in his father and mother.    ROS:  Please see the history of present illness.   Otherwise, review of systems are positive for gassy.   All other systems are reviewed and negative.    PHYSICAL EXAM: VS:  BP 130/72 (BP Location: Right Arm, Patient Position: Sitting, Cuff Size: Normal)   Pulse (!) 54   Ht 5\' 10"  (1.778 m)   Wt 122.5 kg (270 lb)   BMI 38.74 kg/m  , BMI Body mass index is 38.74 kg/m. GENERAL:  Well appearing HEENT:  Pupils equal round and reactive, fundi not visualized, oral mucosa unremarkable NECK:  No jugular venous distention, waveform within normal limits, carotid upstroke brisk and symmetric, no bruits, no thyromegaly LYMPHATICS:  No cervical adenopathy LUNGS:  Clear to auscultation bilaterally HEART:  RRR.  PMI not displaced or sustained,S1 and S2 within normal limits, no S3, no S4, no clicks, no rubs, no murmurs ABD:  Flat, positive bowel sounds normal in frequency in pitch, no bruits, no rebound, no guarding, no midline pulsatile mass, no hepatomegaly, no splenomegaly EXT:  2 plus pulses throughout, no edema, no cyanosis no clubbing SKIN:  No rashes no nodules NEURO:  Cranial nerves II through XII grossly intact, motor grossly intact throughout PSYCH:  Cognitively intact, oriented to person place and time    EKG:  EKG is ordered today. The ekg ordered today demonstrates sinus bradycardia rate 54 bpm.   LHC 2007:  Normal coronary arteries  Echo 08/22/12:  Study Conclusions  - Left ventricle: The cavity size was normal. Wall thickness was increased in a pattern of mild LVH. Systolic  function was normal. The estimated ejection fraction was in the range of 55% to 60%. Features are consistent with a pseudonormal left ventricular filling pattern, with concomitant abnormal relaxation and increased filling pressure (grade 2 diastolic dysfunction). - Mitral valve: Mild regurgitation.  Recent Labs: 11/18/2015: BUN 15; Creatinine, Ser 1.14; Hemoglobin 14.2; Platelets 209.0; Potassium 4.0; Sodium 138    Lipid Panel    Component Value Date/Time   CHOL 142 11/18/2015 1346   TRIG 44.0 11/18/2015 1346   HDL 48.50 11/18/2015 1346   CHOLHDL 3 11/18/2015 1346   VLDL 8.8 11/18/2015 1346   LDLCALC 84 11/18/2015 1346  LDLDIRECT 149.1 01/19/2013 1149      Wt Readings from Last 3 Encounters:  11/28/15 122.5 kg (270 lb)  11/18/15 121.8 kg (268 lb 9.6 oz)  09/06/14 117.8 kg (259 lb 11.2 oz)      ASSESSMENT AND PLAN:  # Atypical chest pain:  Mr. Rushlow' chest pain is very atypical.  He isn't very physically active so it is hard to know whether the chest pain is exertional.  He does have shortness of breath with exertion.  We will get an exercise Myoview to better assess.  Continue aspirin.   # Diastolic dysfunction: Grade 2 diastolic dysfunction was noted on his echo in 2014.  He doesn't have any evidence of heart failure on exam but does have exertional shortness of breath.  We discussed the importance of BP control.  # Hypertension: Blood pressure is well-controlled on amlodipine, nebivolol, irbesartan and HCTZ.  # Obesity: We discussed the importance of increasing his exercise to at least 30-40 minutes most days of the week if his stress is negative.  # OSA: Continue CPAP.   Current medicines are reviewed at length with the patient today.  The patient does not have concerns regarding medicines.  The following changes have been made:  no change  Labs/ tests ordered today include:   Orders Placed This Encounter  Procedures  . Myocardial Perfusion Imaging    . EKG 12-Lead     Disposition:   FU with Jaylissa Felty C. Oval Linsey, MD, [facility] in 1 year.   This note was written with the assistance of speech recognition software.  Please excuse any transcriptional errors.  Signed, Myanna Ziesmer C. Oval Linsey, MD, [facility]  11/30/2015 8:25 PM    [facility]

## 2015-11-30 ENCOUNTER — Encounter: Payer: Self-pay | Admitting: Cardiovascular Disease

## 2015-11-30 DIAGNOSIS — I5189 Other ill-defined heart diseases: Secondary | ICD-10-CM | POA: Insufficient documentation

## 2015-11-30 HISTORY — DX: Other ill-defined heart diseases: I51.89

## 2015-12-10 ENCOUNTER — Inpatient Hospital Stay (HOSPITAL_COMMUNITY): Admission: RE | Admit: 2015-12-10 | Payer: 59 | Source: Ambulatory Visit

## 2015-12-19 ENCOUNTER — Telehealth (HOSPITAL_COMMUNITY): Payer: Self-pay

## 2015-12-19 NOTE — Telephone Encounter (Signed)
Encounter complete. 

## 2015-12-24 ENCOUNTER — Ambulatory Visit (HOSPITAL_COMMUNITY)
Admission: RE | Admit: 2015-12-24 | Discharge: 2015-12-24 | Disposition: A | Discharge status: Home / Self Care | Payer: 59 | Source: Ambulatory Visit | Attending: Internal Medicine | Admitting: Internal Medicine

## 2015-12-24 DIAGNOSIS — R079 Chest pain, unspecified: Secondary | ICD-10-CM | POA: Diagnosis present

## 2015-12-24 DIAGNOSIS — E669 Obesity, unspecified: Secondary | ICD-10-CM | POA: Diagnosis not present

## 2015-12-24 DIAGNOSIS — I10 Essential (primary) hypertension: Secondary | ICD-10-CM | POA: Insufficient documentation

## 2015-12-24 DIAGNOSIS — G4733 Obstructive sleep apnea (adult) (pediatric): Secondary | ICD-10-CM | POA: Insufficient documentation

## 2015-12-24 DIAGNOSIS — I252 Old myocardial infarction: Secondary | ICD-10-CM | POA: Diagnosis not present

## 2015-12-24 DIAGNOSIS — J45909 Unspecified asthma, uncomplicated: Secondary | ICD-10-CM | POA: Diagnosis not present

## 2015-12-24 LAB — MYOCARDIAL PERFUSION IMAGING
CHL CUP NUCLEAR SRS: 0
CHL CUP NUCLEAR SSS: 5
CSEPEW: 10.4 METS
CSEPPHR: 150 {beats}/min
Exercise duration (min): 9 min
Exercise duration (sec): 0 s
LVDIAVOL: 191 mL (ref 62–150)
LVSYSVOL: 88 mL
MPHR: 174 {beats}/min
NUC STRESS TID: 1.12
Percent HR: 86 %
RPE: 18
Rest HR: 70 {beats}/min
SDS: 5

## 2015-12-24 MED ORDER — TECHNETIUM TC 99M TETROFOSMIN IV KIT
32.0000 | PACK | Freq: Once | INTRAVENOUS | Status: AC | PRN
  Administered 2015-12-24: 32 via INTRAVENOUS
  Filled 2015-12-24: qty 32

## 2015-12-24 MED ORDER — TECHNETIUM TC 99M TETROFOSMIN IV KIT
10.7000 | PACK | Freq: Once | INTRAVENOUS | Status: AC | PRN
  Administered 2015-12-24: 10.7 via INTRAVENOUS
  Filled 2015-12-24: qty 11

## 2016-03-02 ENCOUNTER — Other Ambulatory Visit: Payer: Self-pay | Admitting: Family Medicine

## 2016-03-11 ENCOUNTER — Other Ambulatory Visit: Payer: Self-pay | Admitting: Family Medicine

## 2016-03-16 NOTE — Progress Notes (Signed)
HPI:  Samuel Wiley is a pleasant 46 y.o. here for follow up. Chronic medical problems summarized below were reviewed for changes and stability and were updated as needed below. These issues and their treatment remain stable for the most part. New issue of finger pain, see below. Otherwise doing well. Exercises for 15 minutes 2 x per week. Poor diet - hamburger, processed foods. Fasting for labs.  Denies swelling, CP, SOB, DOE, treatment intolerance or new symptoms. Due for labs.  R hand/4th digit pain: -about 1 year -small lump on side of proximal finger/catches and is sore at times -denies:paresthesia, locking, injury, redness  Prediabetes/Obesity:  -advised lifestyle changes   Hyperlipidemia:  -resolved with lifestyle changes  Resistant HTN/LVH, OSA:  -meds:norvasc 10, asa, irbesartan-hctz(avalide) 150-12.5, nebivolol  -managed by his cardiologist  -s/p normal (per cardiology note) myocardial perfusion stress test for CP 12/2015 -wears cpap  -Settings are 18 mmg Hg  -denies: CP, sob, swelling, syncope   ED:  -followed by [facility]  -denies: priapism, LUTS worsening  Hx anxiety and Depression -advised psychiatry care in 2016 -reports doing well  ROS: See pertinent positives and negatives per HPI.  Past Medical History:  Diagnosis Date  . Anxiety and depression    looses weight with episodes, advised to see psychiatrist 01/2014  . Asthma    childhood  . Chicken pox   . Diastolic dysfunction 123456   Grade 2 diastolic dysfunction on echo 08/2012.  Marland Kitchen GERD (gastroesophageal reflux disease)   . Heart palpitations   . History of echocardiogram    (5/09) showed EF 50-55%, mild LV dilation, mild LV hypertrophy, pseudonormal  diastolic function, mild mitral regurgitation, and mild left atrial enlargement.;  Echo 8/14:  Mild LVH, EF 55-60%, Gr 2 DD, mild MR, mild LAE  . Hypertension    high blood pressure readings   . Mild mitral regurgitation by  prior echocardiogram   . Obesity   . Obstructive sleep apnea   . Perioperative myocardial infarction    Perioperative troponin elevation in 2007 after the patient's uvuloplasty.  He did have some chest pain at the time as well.  Left heart catheterization at that time showed normal coronary arteries and normal renal  arteries, EF was 50%.    Past Surgical History:  Procedure Laterality Date  . CARDIAC CATHETERIZATION     EF of 50% with Normal coronary arteries, normal renal arteries  . MASS EXCISION Left 10/09/2014   Procedure: EXCISION LEFT NECK LIPOMA;  Surgeon: Rozetta Nunnery, MD;  Location: [place];  Service: ENT;  Laterality: Left;    Family History  Problem Relation Age of Onset  . Hypertension Father     family history of early onset  . Hypertension Mother   . Diabetes Mother     Social History   Social History  . Marital status: Single    Spouse name: N/A  . Number of children: N/A  . Years of education: N/A   Social History Main Topics  . Smoking status: Never Smoker  . Smokeless tobacco: Never Used  . Alcohol use Yes     Comment: few drinks occ  . Drug use: Unknown  . Sexual activity: Not Asked   Other Topics Concern  . 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  amLODipine (NORVASC) 10 MG tablet, TAKE 1 TABLET BY MOUTH EVERY DAY, Disp: 90 tablet, Rfl: 1 .  arginine 500 MG tablet, Take 1,000 mg by mouth 3 (three) times daily. , Disp: , Rfl:  .  aspirin 81 MG tablet, Take 81 mg by mouth daily., Disp: , Rfl:  .  BYSTOLIC 5 MG tablet, TAKE 1 TABLET BY MOUTH EVERY DAY, Disp: 30 tablet, Rfl: 0 .  Citrulline 600 MG CAPS, Take 1,000 capsules by mouth 2 (two) times a week. , Disp: , Rfl:  .  Coenzyme Q10 (COQ10) 100 MG CAPS,  Take 1 capsule by mouth daily., Disp: , Rfl:  .  hydrochlorothiazide (MICROZIDE) 12.5 MG capsule, TAKE ONE CAPSULE BY MOUTH EVERY DAY, Disp: 30 capsule, Rfl: 0 .  irbesartan (AVAPRO) 150 MG tablet, TAKE 1 TABLET BY MOUTH EVERY DAY, Disp: 30 tablet, Rfl: 0 .  Sildenafil Citrate (VIAGRA PO), Take by mouth as needed., Disp: , Rfl:  .  terazosin (HYTRIN) 10 MG capsule, Take 10 mg by mouth daily., Disp: , Rfl:   EXAM:  Vitals:   03/17/16 0845  BP: 116/80  Pulse: 62  Temp: 98.3 F (36.8 C)    Body mass index is 38.93 kg/m.  GENERAL: vitals reviewed and listed above, alert, oriented, appears well hydrated and in no acute distress  HEENT: atraumatic, conjunttiva clear, no obvious abnormalities on inspection of external nose and ears  NECK: no obvious masses on inspection  LUNGS: clear to auscultation bilaterally, no wheezes, rales or rhonchi, good air movement  CV: HRRR, no peripheral edema  MS: moves all extremities without noticeable abnormality, small mobile sub cut nodule R dorsal 4th digit  PSYCH: pleasant and cooperative, no obvious depression or anxiety  ASSESSMENT AND PLAN:  Discussed the following assessment and plan:  Finger pain, right - Plan: Ambulatory referral to Sports Medicine -we discussed possible serious and likely etiologies, workup and treatment, treatment risks and return precautions - suspect tendon cyst -after this discussion, [name] opted for sports med eval/treat  Essential hypertension - Plan: Basic metabolic panel, CBC BMI [id number] Sleep apnea, unspecified type -labs, lifestyle recs - see orders and pt instructions -continue current medicaitons  -Patient advised to return or notify a doctor immediately if symptoms worsen or persist or new concerns arise.  Patient Instructions  BEFORE YOU LEAVE: -follow up: 3-4 months -labs  -We placed a referral for you as discussed for the finger pain. It usually takes about 1-2 weeks to process and  schedule this referral. If you have not heard from [place] regarding this appointment in 2 weeks please contact our office.  -Please increase aerobic exercise to 150 minutes per week and eat a health diet  We have ordered labs or studies at this visit. It can take up to 1-2 weeks for results and processing. IF results require follow up or explanation, we will call you with instructions. Clinically stable results will be released to your [facility]. If you have not heard from [place] or cannot find your results in [facility] in 2 weeks please contact our office at [phone].  If you are not yet signed up for [facility], please consider signing up.   We recommend the following healthy lifestyle for LIFE: 1) Small portions.   Tip: eat off of a salad plate instead of a dinner plate..  Tip: if you need more or a snack  choose fruits, veggies and/or a handful of nuts or seeds.  2) Eat a healthy clean diet.  * Tip: Avoid (less then 1 serving per week): processed foods, sweets, sweetened drinks, white starches (rice, flour, bread, potatoes, pasta, etc), red meat, fast foods, butter  *Tip: CHOOSE instead   * 5-9 servings per day of fresh or frozen fruits and vegetables (but not corn, potatoes, bananas, canned or dried fruit)   *nuts and seeds, beans   *olives and olive oil   *small portions of lean meats such as fish and white chicken    *small portions of whole grains  3)Get at least 150 minutes of sweaty aerobic exercise per week.  4)Reduce stress - consider counseling, meditation and relaxation to balance other aspects of your life.    WE NOW OFFER   [facility] FAST TRACK!!!  SAME DAY Appointments for ACUTE CARE  Such as: Sprains, Injuries, cuts, abrasions, rashes, muscle pain, joint pain, back pain Colds, flu, sore throats, headache, allergies, cough, fever  Ear pain, sinus and eye infections Abdominal pain, nausea, vomiting, diarrhea, upset stomach Animal/insect bites  3 Easy Ways to  Schedule: Walk-In Scheduling Call in scheduling Mychart Sign-up: [email or web address]               Colin Benton R., DO

## 2016-03-17 ENCOUNTER — Encounter: Payer: Self-pay | Admitting: Family Medicine

## 2016-03-17 ENCOUNTER — Ambulatory Visit (INDEPENDENT_AMBULATORY_CARE_PROVIDER_SITE_OTHER): Payer: 59 | Admitting: Family Medicine

## 2016-03-17 VITALS — BP 116/80 | HR 62 | Temp 98.3°F | Ht 70.0 in | Wt 271.3 lb

## 2016-03-17 DIAGNOSIS — G4730 Sleep apnea, unspecified: Secondary | ICD-10-CM

## 2016-03-17 DIAGNOSIS — M79644 Pain in right finger(s): Secondary | ICD-10-CM

## 2016-03-17 DIAGNOSIS — Z6838 Body mass index (BMI) 38.0-38.9, adult: Secondary | ICD-10-CM

## 2016-03-17 DIAGNOSIS — I10 Essential (primary) hypertension: Secondary | ICD-10-CM | POA: Diagnosis not present

## 2016-03-17 LAB — BASIC METABOLIC PANEL
BUN: 19 mg/dL (ref 6–23)
CHLORIDE: 99 meq/L (ref 96–112)
CO2: 32 mEq/L (ref 19–32)
CREATININE: 1.16 mg/dL (ref 0.40–1.50)
Calcium: 9.8 mg/dL (ref 8.4–10.5)
GFR: 86.71 mL/min (ref >60.00)
GLUCOSE: 101 mg/dL — AB (ref 70–99)
Potassium: 4.6 mEq/L (ref 3.5–5.1)
Sodium: 136 mEq/L (ref 135–145)

## 2016-03-17 LAB — CBC
HCT: 43.7 % (ref 39.0–52.0)
Hemoglobin: 14.5 g/dL (ref 13.0–17.0)
MCHC: 33.1 g/dL (ref 30.0–36.0)
MCV: 81.2 fl (ref 78.0–100.0)
Platelets: 208 10*3/uL (ref 150.0–400.0)
RBC: 5.38 Mil/uL (ref 4.22–5.81)
RDW: 13.9 % (ref 11.5–15.5)
WBC: 3.7 10*3/uL — AB (ref 4.0–10.5)

## 2016-03-17 NOTE — Patient Instructions (Signed)
BEFORE YOU LEAVE: -follow up: 3-4 months -labs  -We placed a referral for you as discussed for the finger pain. It usually takes about 1-2 weeks to process and schedule this referral. If you have not heard from [place] regarding this appointment in 2 weeks please contact our office.  -Please increase aerobic exercise to 150 minutes per week and eat a health diet  We have ordered labs or studies at this visit. It can take up to 1-2 weeks for results and processing. IF results require follow up or explanation, we will call you with instructions. Clinically stable results will be released to your [facility]. If you have not heard from [place] or cannot find your results in [facility] in 2 weeks please contact our office at [phone].  If you are not yet signed up for [facility], please consider signing up.   We recommend the following healthy lifestyle for LIFE: 1) Small portions.   Tip: eat off of a salad plate instead of a dinner plate..  Tip: if you need more or a snack choose fruits, veggies and/or a handful of nuts or seeds.  2) Eat a healthy clean diet.  * Tip: Avoid (less then 1 serving per week): processed foods, sweets, sweetened drinks, white starches (rice, flour, bread, potatoes, pasta, etc), red meat, fast foods, butter  *Tip: CHOOSE instead   * 5-9 servings per day of fresh or frozen fruits and vegetables (but not corn, potatoes, bananas, canned or dried fruit)   *nuts and seeds, beans   *olives and olive oil   *small portions of lean meats such as fish and white chicken    *small portions of whole grains  3)Get at least 150 minutes of sweaty aerobic exercise per week.  4)Reduce stress - consider counseling, meditation and relaxation to balance other aspects of your life.    WE NOW OFFER   [facility] FAST TRACK!!!  SAME DAY Appointments for ACUTE CARE  Such as: Sprains, Injuries, cuts, abrasions, rashes, muscle pain, joint pain, back pain Colds, flu, sore throats,  headache, allergies, cough, fever  Ear pain, sinus and eye infections Abdominal pain, nausea, vomiting, diarrhea, upset stomach Animal/insect bites  3 Easy Ways to Schedule: Walk-In Scheduling Call in scheduling Mychart Sign-up: [email or web address]

## 2016-03-17 NOTE — Progress Notes (Signed)
Pre visit review using our clinic review tool, if applicable. No additional management support is needed unless otherwise documented below in the visit note. 

## 2016-03-24 ENCOUNTER — Other Ambulatory Visit: Payer: Self-pay | Admitting: Family Medicine

## 2016-03-28 NOTE — Progress Notes (Signed)
[facility] [address] [place], [place] [address] Phone: [phone] Subjective:    I'm seeing this patient by the request  of:  Lucretia Kern., DO   CC: Right finger pain  HER:[id number]  Samuel Wiley is a 46 y.o. male coming in with complaint of right finger pain. Patient states that this is on her right hand. Seems to be the fourth digit. Been going on for approximately year. States that there is a small lump on the proximal finger that sometimes catches and gets in the way. Denies that it is locked, does not remember any true injury. Denies any numbness. Denies any weakness. States that now it is standing in his weight.     Past Medical History:  Diagnosis Date  . Anxiety and depression    looses weight with episodes, advised to see psychiatrist 01/2014  . Asthma    childhood  . Chicken pox   . Diastolic dysfunction 56/31/4970   Grade 2 diastolic dysfunction on echo 08/2012.  Marland Kitchen GERD (gastroesophageal reflux disease)   . Heart palpitations   . History of echocardiogram    (5/09) showed EF 50-55%, mild LV dilation, mild LV hypertrophy, pseudonormal  diastolic function, mild mitral regurgitation, and mild left atrial enlargement.;  Echo 8/14:  Mild LVH, EF 55-60%, Gr 2 DD, mild MR, mild LAE  . Hypertension    high blood pressure readings   . Mild mitral regurgitation by prior echocardiogram   . Obesity   . Obstructive sleep apnea   . Perioperative myocardial infarction    Perioperative troponin elevation in 2007 after the patient's uvuloplasty.  He did have some chest pain at the time as well.  Left heart catheterization at that time showed normal coronary arteries and normal renal  arteries, EF was 50%.   Past Surgical History:  Procedure Laterality Date  . CARDIAC CATHETERIZATION     EF of 50% with Normal coronary arteries, normal renal arteries  . MASS EXCISION Left 10/09/2014   Procedure: EXCISION LEFT NECK LIPOMA;  Surgeon: Rozetta Nunnery, MD;  Location: [place];  Service: ENT;  Laterality: Left;   Social History   Social History  . Marital status: Single    Spouse name: N/A  . Number of children: N/A  . Years of education: N/A   Social History Main Topics  . Smoking status: Never Smoker  . Smokeless tobacco: Never Used  . Alcohol use Yes     Comment: few drinks occ  . Drug use: Unknown  . Sexual activity: Not on file   Other Topics Concern  . 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 Milk Protein   . Clavulanic Acid Diarrhea   Family History  Problem Relation Age of Onset  . Hypertension Father     family history of early onset  . Hypertension Mother   . Diabetes Mother     Past medical history, social, surgical and family history all reviewed in electronic medical record.  No pertanent information unless stated regarding to the chief complaint.   Review of Systems:Review of systems updated and as accurate as of 03/28/16  No headache, visual changes, nausea, vomiting, diarrhea, constipation, dizziness, abdominal pain, skin rash, fevers, chills, night sweats, weight loss, swollen lymph nodes, body aches, joint swelling, muscle aches, chest pain, shortness of breath, mood changes.   Objective  There were no vitals taken for this visit. Systems examined below as of 03/28/16   General: No apparent distress alert and oriented x3 mood and affect normal, dressed appropriately.  HEENT: Pupils equal, extraocular movements intact  Respiratory: Patient's speak in full sentences and does not appear short of breath  Cardiovascular: No lower extremity edema, non tender, no erythema  Skin: Warm dry intact with no signs of infection or rash on extremities or on axial skeleton.    Abdomen: Soft nontender  Neuro: Cranial nerves II through XII are intact, neurovascularly intact in all extremities with 2+ DTRs and 2+ pulses.  Lymph: No lymphadenopathy of posterior or anterior cervical chain or axillae bilaterally.  Gait normal with good balance and coordination.  MSK:  Non tender with full range of motion and good stability and symmetric strength and tone of shoulders, elbows, wrist, hip, knee and ankles bilaterally.  Hand exam shows the patient's fourth finger does have what appears to be a trigger nodule but more on the ulnar aspect. Seems to be freely movable. It does move within the tendon sheath.  Limited muscular skeletal ultrasound was performed and interpreted by Lyndal Pulley  Limited ultrasound of patient's fourth finger shows the patient does have a simple fluid-filled cyst but does seem to being in concert within the flexor tendon sheath. Impression: Cyst of the fourth finger within the flexor tendon sheath  After verbal consent patient was prepped with call swabs and with a 21-gauge 2 inch needle patient was injected with a total of 0.5 mL of 0.5% Marcaine. We'll like material was removed. Patient was injected with 0.5 mL of 40 mg/dL Kenalog. Post injection instructions given.   Impression and Recommendations:     This case required medical decision making of moderate complexity.      Note: This dictation was prepared with Dragon dictation along with smaller phrase technology. Any transcriptional errors that result from this process are unintentional.

## 2016-03-30 ENCOUNTER — Ambulatory Visit (INDEPENDENT_AMBULATORY_CARE_PROVIDER_SITE_OTHER): Payer: 59 | Admitting: Family Medicine

## 2016-03-30 ENCOUNTER — Encounter: Payer: Self-pay | Admitting: Family Medicine

## 2016-03-30 ENCOUNTER — Ambulatory Visit: Payer: Self-pay

## 2016-03-30 VITALS — BP 136/84 | HR 86 | Ht 70.0 in | Wt 270.8 lb

## 2016-03-30 DIAGNOSIS — M79644 Pain in right finger(s): Secondary | ICD-10-CM | POA: Diagnosis not present

## 2016-03-30 DIAGNOSIS — M67441 Ganglion, right hand: Secondary | ICD-10-CM

## 2016-03-30 NOTE — Assessment & Plan Note (Signed)
Patient had what appeared to be a flexor tendon sheath but unfortunately was actually a ganglion cyst. Patient did have injection and tolerated the procedure well. We discussed icing regimen and home exercises. We discussed which activities to do. Patient come back

## 2016-03-30 NOTE — Patient Instructions (Signed)
Good to see you  Samuel Wiley is your friend.  This was a cyst and should do well for sometime but may come back.  Make an appointment in 2 weeks and if it is there come on back otherwise see me when you need me.

## 2016-04-14 ENCOUNTER — Ambulatory Visit: Payer: 59 | Admitting: Family Medicine

## 2016-06-17 NOTE — Progress Notes (Signed)
HPI:  Samuel Wiley is a pleasant 46 y.o. here for follow up. Chronic medical problems summarized below were reviewed for changes and stability and were updated as needed below. These issues and their treatment remain stable for the most part. Sent to sports med last visit for hand pain. Denies CP, SOB, DOE, treatment intolerance or new symptoms. Due for labs: bmp, cbc, hgba1c  Prediabetes/Obesity:   -advised lifestyle changes   Hyperlipidemia:  -resolved with lifestyle changes  Resistant HTN/LVH, OSA:  -meds:norvasc 10, asa, irbesartan-hctz(avalide) 150-12.5, nebivolol  -managed by his cardiologist  -s/p normal (per cardiology note) myocardial perfusion stress test for CP 12/2015 -wears cpap  -Settings are 18 mmg Hg  -denies: CP, sob, swelling, syncope   ED:  -[facility]  -denies: priapism, LUTS worsening  Hx anxiety and Depression -advised psychiatry care in 2016 -reports doing well  ROS: See pertinent positives and negatives per HPI.  Past Medical History:  Diagnosis Date  . Anxiety and depression    looses weight with episodes, advised to see psychiatrist 01/2014  . Asthma    childhood  . Chicken pox   . Diastolic dysfunction 63/01/6008   Grade 2 diastolic dysfunction on echo 08/2012.  Marland Kitchen GERD (gastroesophageal reflux disease)   . Heart palpitations   . History of echocardiogram    (5/09) showed EF 50-55%, mild LV dilation, mild LV hypertrophy, pseudonormal  diastolic function, mild mitral regurgitation, and mild left atrial enlargement.;  Echo 8/14:  Mild LVH, EF 55-60%, Gr 2 DD, mild MR, mild LAE  . Hypertension    high blood pressure readings   . Mild mitral regurgitation by prior echocardiogram   . Obesity   . Obstructive sleep apnea   . Perioperative myocardial infarction    Perioperative troponin elevation in 2007 after the patient's uvuloplasty.  He did have some chest pain at the time as well.  Left heart catheterization at  that time showed normal coronary arteries and normal renal  arteries, EF was 50%.    Past Surgical History:  Procedure Laterality Date  . CARDIAC CATHETERIZATION     EF of 50% with Normal coronary arteries, normal renal arteries  . MASS EXCISION Left 10/09/2014   Procedure: EXCISION LEFT NECK LIPOMA;  Surgeon: Rozetta Nunnery, MD;  Location: [place];  Service: ENT;  Laterality: Left;    Family History  Problem Relation Age of Onset  . Hypertension Father        family history of early onset  . Hypertension Mother   . Diabetes Mother     Social History   Social History  . Marital status: Single    Spouse name: N/A  . Number of children: N/A  . Years of education: N/A   Social History Main Topics  . Smoking status: Never Smoker  . Smokeless tobacco: Never Used  . Alcohol use Yes     Comment: few drinks occ  . Drug use: Unknown  . Sexual activity: Not on file   Other Topics Concern  . 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  amLODipine (NORVASC) 10 MG tablet, TAKE 1 TABLET BY MOUTH EVERY DAY,  Disp: 90 tablet, Rfl: 1 .  arginine 500 MG tablet, Take 1,000 mg by mouth 3 (three) times daily. , Disp: , Rfl:  .  aspirin 81 MG tablet, Take 81 mg by mouth daily., Disp: , Rfl:  .  BYSTOLIC 5 MG tablet, TAKE 1 TABLET BY MOUTH EVERY DAY, Disp: 30 tablet, Rfl: 0 .  BYSTOLIC 5 MG tablet, TAKE 1 TABLET BY MOUTH EVERY DAY, Disp: 90 tablet, Rfl: 1 .  Citrulline 600 MG CAPS, Take 1,000 capsules by mouth 2 (two) times a week. , Disp: , Rfl:  .  Coenzyme Q10 (COQ10) 100 MG CAPS, Take 1 capsule by mouth daily., Disp: , Rfl:  .  hydrochlorothiazide (MICROZIDE) 12.5 MG capsule, TAKE ONE CAPSULE BY MOUTH EVERY DAY, Disp: 30 capsule, Rfl: 0 .   hydrochlorothiazide (MICROZIDE) 12.5 MG capsule, TAKE ONE CAPSULE BY MOUTH EVERY DAY, Disp: 90 capsule, Rfl: 1 .  irbesartan (AVAPRO) 150 MG tablet, TAKE 1 TABLET BY MOUTH EVERY DAY, Disp: 30 tablet, Rfl: 0 .  Sildenafil Citrate (VIAGRA PO), Take by mouth as needed., Disp: , Rfl:  .  terazosin (HYTRIN) 10 MG capsule, Take 10 mg by mouth daily., Disp: , Rfl:   EXAM:  There were no vitals filed for this visit.  There is no height or weight on file to calculate BMI.  GENERAL: vitals reviewed and listed above, alert, oriented, appears well hydrated and in no acute distress  HEENT: atraumatic, conjunttiva clear, no obvious abnormalities on inspection of external nose and ears  NECK: no obvious masses on inspection  LUNGS: clear to auscultation bilaterally, no wheezes, rales or rhonchi, good air movement  CV: HRRR, no peripheral edema  MS: moves all extremities without noticeable abnormality  PSYCH: pleasant and cooperative, no obvious depression or anxiety  ASSESSMENT AND PLAN:  Discussed the following assessment and plan:  No diagnosis found.  -Patient advised to return or notify a doctor immediately if symptoms worsen or persist or new concerns arise.  There are no Patient Instructions on file for this visit.  Colin Benton R., DO

## 2016-06-18 ENCOUNTER — Encounter: Payer: Self-pay | Admitting: Family Medicine

## 2016-06-18 ENCOUNTER — Ambulatory Visit (INDEPENDENT_AMBULATORY_CARE_PROVIDER_SITE_OTHER): Payer: 59 | Admitting: Family Medicine

## 2016-06-18 VITALS — BP 120/78 | HR 53 | Temp 98.2°F | Ht 70.0 in | Wt 273.6 lb

## 2016-06-18 DIAGNOSIS — R739 Hyperglycemia, unspecified: Secondary | ICD-10-CM | POA: Diagnosis not present

## 2016-06-18 DIAGNOSIS — R05 Cough: Secondary | ICD-10-CM

## 2016-06-18 DIAGNOSIS — D72819 Decreased white blood cell count, unspecified: Secondary | ICD-10-CM

## 2016-06-18 DIAGNOSIS — R059 Cough, unspecified: Secondary | ICD-10-CM

## 2016-06-18 DIAGNOSIS — R799 Abnormal finding of blood chemistry, unspecified: Secondary | ICD-10-CM

## 2016-06-18 DIAGNOSIS — J309 Allergic rhinitis, unspecified: Secondary | ICD-10-CM

## 2016-06-18 DIAGNOSIS — E6609 Other obesity due to excess calories: Secondary | ICD-10-CM

## 2016-06-18 DIAGNOSIS — I10 Essential (primary) hypertension: Secondary | ICD-10-CM | POA: Diagnosis not present

## 2016-06-18 LAB — HEMOGLOBIN A1C: Hgb A1c MFr Bld: 6.3 % (ref 4.6–6.5)

## 2016-06-18 LAB — BASIC METABOLIC PANEL
BUN: 25 mg/dL — ABNORMAL HIGH (ref 6–23)
CHLORIDE: 102 meq/L (ref 96–112)
CO2: 29 meq/L (ref 19–32)
Calcium: 9.8 mg/dL (ref 8.4–10.5)
Creatinine, Ser: 1.24 mg/dL (ref 0.40–1.50)
GFR: 80.2 mL/min (ref >60.00)
Glucose, Bld: 108 mg/dL — ABNORMAL HIGH (ref 70–99)
POTASSIUM: 4.6 meq/L (ref 3.5–5.1)
SODIUM: 137 meq/L (ref 135–145)

## 2016-06-18 LAB — CBC
HEMATOCRIT: 43.7 % (ref 39.0–52.0)
Hemoglobin: 14.4 g/dL (ref 13.0–17.0)
MCHC: 33 g/dL (ref 30.0–36.0)
MCV: 81.8 fl (ref 78.0–100.0)
PLATELETS: 221 10*3/uL (ref 150.0–400.0)
RBC: 5.33 Mil/uL (ref 4.22–5.81)
RDW: 13.9 % (ref 11.5–15.5)
WBC: 3.6 10*3/uL — AB (ref 4.0–10.5)

## 2016-06-18 MED ORDER — ALBUTEROL SULFATE HFA 108 (90 BASE) MCG/ACT IN AERS: 2.0000 | INHALATION_SPRAY | Freq: Four times a day (QID) | RESPIRATORY_TRACT | 0 refills | Status: DC | PRN

## 2016-06-18 NOTE — Progress Notes (Signed)
HPI:  Samuel Wiley is a pleasant 46 y.o. here for follow up. Chronic medical problems summarized below were reviewed for changes and stability and were updated as needed below. These issues and their treatment remain stable for the most part. Sent to sports med last visit for hand pain. Reports cyst resolved with inj/asp but returned some. He has had some trouble with his allergies this spring (sneezing, nasal drip, itchy eyes and some mild wheezing at times.) Has a history of asthma as a child and feels has had some mild asthma symptoms at times - sensation of needing to cough and mild wheeze during the spring. Not taking any medications. Reports INS causes sinus infections for him. Mood ok. Working in [place] a lot. Denies CP, SOB, DOE, fevers, malaise, treatment intolerance or new symptoms. Due for labs: bmp, cbc, hgba1c  Prediabetes/Obesity:   -advised lifestyle changes   Hyperlipidemia:  -resolved with lifestyle changes  Resistant HTN/LVH, OSA:  -meds:norvasc 10, asa, irbesartan-hctz(avalide) 150-12.5, nebivolol  -managed by his cardiologist  -s/p normal (per cardiology note) myocardial perfusion stress test for CP 12/2015 -wears cpap  -Settings are 18 mmg Hg  -denies: CP, sob, swelling, syncope   ED:  -[facility]  -denies: priapism, LUTS worsening  Hx anxiety and Depression -advised psychiatry care in 2016 -reports doing well  ROS: See pertinent positives and negatives per HPI.  Past Medical History:  Diagnosis Date  . Anxiety and depression    looses weight with episodes, advised to see psychiatrist 01/2014  . Asthma    childhood  . Chicken pox   . Diastolic dysfunction 80/03/4915   Grade 2 diastolic dysfunction on echo 08/2012.  Marland Kitchen GERD (gastroesophageal reflux disease)   . Heart palpitations   . History of echocardiogram    (5/09) showed EF 50-55%, mild LV dilation, mild LV hypertrophy, pseudonormal  diastolic function, mild mitral  regurgitation, and mild left atrial enlargement.;  Echo 8/14:  Mild LVH, EF 55-60%, Gr 2 DD, mild MR, mild LAE  . Hypertension    high blood pressure readings   . Mild mitral regurgitation by prior echocardiogram   . Obesity   . Obstructive sleep apnea   . Perioperative myocardial infarction    Perioperative troponin elevation in 2007 after the patient's uvuloplasty.  He did have some chest pain at the time as well.  Left heart catheterization at that time showed normal coronary arteries and normal renal  arteries, EF was 50%.    Past Surgical History:  Procedure Laterality Date  . CARDIAC CATHETERIZATION     EF of 50% with Normal coronary arteries, normal renal arteries  . MASS EXCISION Left 10/09/2014   Procedure: EXCISION LEFT NECK LIPOMA;  Surgeon: Rozetta Nunnery, MD;  Location: [place];  Service: ENT;  Laterality: Left;    Family History  Problem Relation Age of Onset  . Hypertension Father        family history of early onset  . Hypertension Mother   . Diabetes Mother     Social History   Social History  . Marital status: Single    Spouse name: N/A  . Number of children: N/A  . Years of education: N/A   Social History Main Topics  . Smoking status: Never Smoker  . Smokeless tobacco: Never Used  . Alcohol use Yes     Comment: few drinks occ  . Drug use: Unknown  . Sexual activity: Not Asked   Other Topics Concern  .  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  amLODipine (NORVASC) 10 MG tablet, TAKE 1 TABLET BY MOUTH EVERY DAY, Disp: 90 tablet, Rfl: 1 .  arginine 500 MG tablet, Take 1,000 mg by mouth 3 (three) times daily. , Disp: , Rfl:  .  aspirin 81 MG tablet, Take 81 mg by mouth daily., Disp: , Rfl:  .  BYSTOLIC 5 MG  tablet, TAKE 1 TABLET BY MOUTH EVERY DAY, Disp: 90 tablet, Rfl: 1 .  Citrulline 600 MG CAPS, Take 1,000 capsules by mouth 2 (two) times a week. , Disp: , Rfl:  .  Coenzyme Q10 (COQ10) 100 MG CAPS, Take 1 capsule by mouth daily., Disp: , Rfl:  .  hydrochlorothiazide (MICROZIDE) 12.5 MG capsule, TAKE ONE CAPSULE BY MOUTH EVERY DAY, Disp: 90 capsule, Rfl: 1 .  irbesartan (AVAPRO) 150 MG tablet, TAKE 1 TABLET BY MOUTH EVERY DAY, Disp: 30 tablet, Rfl: 0 .  Sildenafil Citrate (VIAGRA PO), Take by mouth as needed., Disp: , Rfl:  .  terazosin (HYTRIN) 10 MG capsule, Take 10 mg by mouth daily., Disp: , Rfl:  .  albuterol (PROAIR HFA) 108 (90 Base) MCG/ACT inhaler, Inhale 2 puffs into the lungs every 6 (six) hours as needed for wheezing or shortness of breath., Disp: 1 Inhaler, Rfl: 0  EXAM:  Vitals:   06/18/16 0841  BP: 120/78  Pulse: (!) 53  Temp: 98.2 F (36.8 C)    Body mass index is 39.26 kg/m.  GENERAL: vitals reviewed and listed above, alert, oriented, appears well hydrated and in no acute distress  HEENT: atraumatic, conjunttiva clear, no obvious abnormalities on inspection of external nose and ears  NECK: no obvious masses on inspection  LUNGS: clear to auscultation bilaterally, no wheezes, rales or rhonchi, good air movement  CV: HRRR, no peripheral edema  MS: moves all extremities without noticeable abnormality  PSYCH: pleasant and cooperative, no obvious depression or anxiety  ASSESSMENT AND PLAN:  Discussed the following assessment and plan:  Hyperglycemia - Plan: Hemoglobin A1c  Class 1 obesity due to excess calories with serious comorbidity in adult, unspecified BMI  Essential hypertension - Plan: Basic metabolic panel, CBC  Allergic rhinitis, unspecified seasonality, unspecified trigger  Cough  -offered CXR for the wheezing - sounds like mild bronchial involvement with allergies - he opted to try antihistamine and prn alb first. Agrees to follow up if further  symptoms. -labs per orders -lifestyle recs -cont current medications -Patient advised to return or notify a doctor immediately if symptoms worsen or persist or new concerns arise.  Patient Instructions  BEFORE YOU LEAVE: -follow up: 4 months -labs  Try zyrtec at night and the albuterol as needed for the allergies and asthma symptoms. Please follow up promptly if this is not helping or your are getting worse.  Advise regular aerobic exercise (at least 150 minutes per week of sweaty exercise) and a healthy diet. Try to eat at least 5-9 servings of vegetables and fruits per day (not corn, potatoes or bananas.) Avoid sweets, red meat, pork, butter, fried foods, fast food, processed food, excessive dairy, eggs and coconut. Replace bad fats with good fats - fish, nuts and seeds, canola oil,  olive oil.   We have ordered labs or studies at this visit. It can take up to 1-2 weeks for results and processing. IF results require follow up or explanation, we will call you with instructions. Clinically stable results will be released to your [facility]. If you have not heard from [place] or cannot find your results in [facility] in 2 weeks please contact our office at [phone].  If you are not yet signed up for [facility], please consider signing up.          Colin Benton R., DO

## 2016-06-18 NOTE — Patient Instructions (Signed)
BEFORE YOU LEAVE: -follow up: 4 months -labs  Try zyrtec at night and the albuterol as needed for the allergies and asthma symptoms. Please follow up promptly if this is not helping or your are getting worse.  Advise regular aerobic exercise (at least 150 minutes per week of sweaty exercise) and a healthy diet. Try to eat at least 5-9 servings of vegetables and fruits per day (not corn, potatoes or bananas.) Avoid sweets, red meat, pork, butter, fried foods, fast food, processed food, excessive dairy, eggs and coconut. Replace bad fats with good fats - fish, nuts and seeds, canola oil, olive oil.   We have ordered labs or studies at this visit. It can take up to 1-2 weeks for results and processing. IF results require follow up or explanation, we will call you with instructions. Clinically stable results will be released to your [facility]. If you have not heard from [place] or cannot find your results in [facility] in 2 weeks please contact our office at [phone].  If you are not yet signed up for [facility], please consider signing up.

## 2016-06-19 NOTE — Addendum Note (Signed)
Addended by: Lahoma Crocker on: 06/19/2016 02:58 PM   Modules accepted: Orders

## 2016-07-14 DIAGNOSIS — Z125 Encounter for screening for malignant neoplasm of prostate: Secondary | ICD-10-CM | POA: Diagnosis not present

## 2016-07-27 ENCOUNTER — Encounter: Payer: Self-pay | Admitting: Family Medicine

## 2016-07-27 ENCOUNTER — Ambulatory Visit (INDEPENDENT_AMBULATORY_CARE_PROVIDER_SITE_OTHER): Payer: 59 | Admitting: Family Medicine

## 2016-07-27 VITALS — BP 138/80 | HR 72 | Temp 98.4°F | Ht 70.0 in | Wt 268.9 lb

## 2016-07-27 DIAGNOSIS — J029 Acute pharyngitis, unspecified: Secondary | ICD-10-CM

## 2016-07-27 DIAGNOSIS — R350 Frequency of micturition: Secondary | ICD-10-CM | POA: Diagnosis not present

## 2016-07-27 DIAGNOSIS — R739 Hyperglycemia, unspecified: Secondary | ICD-10-CM

## 2016-07-27 DIAGNOSIS — Z6838 Body mass index (BMI) 38.0-38.9, adult: Secondary | ICD-10-CM | POA: Diagnosis not present

## 2016-07-27 LAB — POC URINALSYSI DIPSTICK (AUTOMATED)
BILIRUBIN UA: NEGATIVE
GLUCOSE UA: NEGATIVE
Ketones, UA: NEGATIVE
Leukocytes, UA: NEGATIVE
NITRITE UA: NEGATIVE
PH UA: 6 (ref 5.0–8.0)
Protein, UA: NEGATIVE
RBC UA: NEGATIVE
Spec Grav, UA: >=1.03 — AB (ref 1.010–1.025)
UROBILINOGEN UA: 0.2 U/dL

## 2016-07-27 LAB — POCT RAPID STREP A (OFFICE): RAPID STREP A SCREEN: NEGATIVE

## 2016-07-27 NOTE — Addendum Note (Signed)
Addended by: Agnes Lawrence on: 07/27/2016 04:44 PM   Modules accepted: Orders

## 2016-07-27 NOTE — Patient Instructions (Signed)
BEFORE YOU LEAVE: -urine dip - can do at lab when he goes to draw labs -follow up: 3 months -labs  We have ordered labs or studies at this visit. It can take up to 1-2 weeks for results and processing. IF results require follow up or explanation, we will call you with instructions. Clinically stable results will be released to your [facility]. If you have not heard from [place] or cannot find your results in [facility] in 2 weeks please contact our office at [phone].  If you are not yet signed up for [facility], please consider signing up.  If urinary symptoms persist please follow up with your urologist.  We recommend the following healthy lifestyle for LIFE: 1) Small portions. Regular healthy meals - at least 3 meals daily.  Tip: eat off of a salad plate instead of a dinner plate.  2) Eat a healthy clean diet.   TRY TO EAT: -at least 5-7 servings of low sugar vegetables per day (not corn, potatoes or bananas.) -berries are the best choice if you wish to eat fruit.   -lean meets (fish, chicken or [place] breasts) -vegan proteins for some meals - beans or tofu, whole grains, nuts and seeds -Replace bad fats with good fats - good fats include: fish, nuts and seeds, canola oil, olive oil -small amounts of low fat or non fat dairy -small amounts of100 % whole grains - check the lables  AVOID: -SUGAR, sweets, anything with added sugar, corn syrup or sweeteners -if you must have a sweetener, small amounts of stevia may be best -sweetened beverages -simple starches (rice, bread, potatoes, pasta, chips, etc - small amounts of 100% whole grains are ok) -red meat, pork, butter -fried foods, fast food, processed food, excessive dairy, eggs and coconut.  3)Get at least 150 minutes of sweaty aerobic exercise per week.  4)Reduce stress - consider counseling, meditation and relaxation to balance other aspects of your life.

## 2016-07-27 NOTE — Addendum Note (Signed)
Addended by: Agnes Lawrence on: 07/27/2016 04:30 PM   Modules accepted: Orders

## 2016-07-27 NOTE — Progress Notes (Addendum)
HPI:  Acute visit for "diabetes": -reports urinary frequency above baseline for last month or so -intermittent -sometime 6-7 times per night -denies weakness of stream, hesitancy -he notices more urination with dietary undercorrection or when does not exercise -only eats one meal daily -sees urologist for BPH/erectile dysfunction -on diuretic -due for repeat labs from prior appt, has prediabetes on labs -reports home BS 70-120s  ROS: See pertinent positives and negatives per HPI.  Past Medical History:  Diagnosis Date  . Anxiety and depression    looses weight with episodes, advised to see psychiatrist 01/2014  . Asthma    childhood  . Chicken pox   . Diastolic dysfunction 26/83/4196   Grade 2 diastolic dysfunction on echo 08/2012.  Marland Kitchen GERD (gastroesophageal reflux disease)   . Heart palpitations   . History of echocardiogram    (5/09) showed EF 50-55%, mild LV dilation, mild LV hypertrophy, pseudonormal  diastolic function, mild mitral regurgitation, and mild left atrial enlargement.;  Echo 8/14:  Mild LVH, EF 55-60%, Gr 2 DD, mild MR, mild LAE  . Hypertension    high blood pressure readings   . Mild mitral regurgitation by prior echocardiogram   . Obesity   . Obstructive sleep apnea   . Perioperative myocardial infarction    Perioperative troponin elevation in 2007 after the patient's uvuloplasty.  He did have some chest pain at the time as well.  Left heart catheterization at that time showed normal coronary arteries and normal renal  arteries, EF was 50%.    Past Surgical History:  Procedure Laterality Date  . CARDIAC CATHETERIZATION     EF of 50% with Normal coronary arteries, normal renal arteries  . MASS EXCISION Left 10/09/2014   Procedure: EXCISION LEFT NECK LIPOMA;  Surgeon: Rozetta Nunnery, MD;  Location: [place];  Service: ENT;  Laterality: Left;    Family History  Problem Relation Age of Onset  . Hypertension Father        family  history of early onset  . Hypertension Mother   . Diabetes Mother     Social History   Social History  . Marital status: Single    Spouse name: N/A  . Number of children: N/A  . Years of education: N/A   Social History Main Topics  . Smoking status: Never Smoker  . Smokeless tobacco: Never Used  . Alcohol use Yes     Comment: few drinks occ  . Drug use: Unknown  . Sexual activity: Not Asked   Other Topics Concern  . 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  albuterol (PROAIR HFA) 108 (90 Base) MCG/ACT inhaler, Inhale 2 puffs into the lungs every 6 (six) hours as needed for wheezing or shortness of breath., Disp: 1 Inhaler, Rfl: 0 .  amLODipine (NORVASC) 10 MG tablet, TAKE 1 TABLET BY MOUTH EVERY DAY, Disp: 90 tablet, Rfl: 1 .  arginine 500 MG tablet, Take 1,000 mg by mouth 3 (three) times daily. , Disp: , Rfl:  .  aspirin 81 MG tablet, Take 81 mg by mouth daily., Disp: , Rfl:  .  BYSTOLIC 5 MG tablet, TAKE 1 TABLET BY MOUTH EVERY DAY,  Disp: 90 tablet, Rfl: 1 .  Citrulline 600 MG CAPS, Take 1,000 capsules by mouth 2 (two) times a week. , Disp: , Rfl:  .  Coenzyme Q10 (COQ10) 100 MG CAPS, Take 1 capsule by mouth daily., Disp: , Rfl:  .  hydrochlorothiazide (MICROZIDE) 12.5 MG capsule, TAKE ONE CAPSULE BY MOUTH EVERY DAY, Disp: 90 capsule, Rfl: 1 .  irbesartan (AVAPRO) 150 MG tablet, TAKE 1 TABLET BY MOUTH EVERY DAY, Disp: 30 tablet, Rfl: 0 .  Sildenafil Citrate (VIAGRA PO), Take by mouth as needed., Disp: , Rfl:  .  terazosin (HYTRIN) 10 MG capsule, Take 10 mg by mouth daily., Disp: , Rfl:   EXAM:  Vitals:   07/27/16 1555  BP: 138/80  Pulse: 72  Temp: 98.4 F (36.9 C)    Body mass index is 38.58 kg/m.  GENERAL: vitals reviewed and listed above,  alert, oriented, appears well hydrated and in no acute distress  HEENT: atraumatic, conjunttiva clear, no obvious abnormalities on inspection of external nose and ears  NECK: no obvious masses on inspection  LUNGS: clear to auscultation bilaterally, no wheezes, rales or rhonchi, good air movement  CV: HRRR, no peripheral edema  MS: moves all extremities without noticeable abnormality  PSYCH: pleasant and cooperative, no obvious depression or anxiety  ASSESSMENT AND PLAN:  Discussed the following assessment and plan:  Urinary frequency - Plan: Basic metabolic panel, CBC, POCT Urinalysis Dipstick (Automated)  Hyperglycemia  BMI 38.0-38.9,adult  Sore throat - Plan: POC Rapid Strep A  -we discussed possible serious and likely etiologies, workup and treatment, treatment risks and return precautions -check udip and repeat labs -lifestyle recs for management prediabetes discussed -advised urology f/u if symptoms persist and urine ok -Patient advised to return or notify a doctor immediately if symptoms worsen or persist or new concerns arise.  Addendum: mentioned sore throat to assistant after appt over. Nasal congestion and drainage and sore throat without fever or other symptoms for a few days. Throat ok with mild PND on exam. Strep test negative. Discussed symptomatic care for likely mile VURI or allergies with follow up to recheck advised if worsening or not improving.  Patient Instructions  BEFORE YOU LEAVE: -urine dip - can do at lab when he goes to draw labs -follow up: 3 months -labs  We have ordered labs or studies at this visit. It can take up to 1-2 weeks for results and processing. IF results require follow up or explanation, we will call you with instructions. Clinically stable results will be released to your [facility]. If you have not heard from [place] or cannot find your results in [facility] in 2 weeks please contact our office at [phone].  If you are not yet signed up  for [facility], please consider signing up.  If urinary symptoms persist please follow up with your urologist.  We recommend the following healthy lifestyle for LIFE: 1) Small portions. Regular healthy meals - at least 3 meals daily.  Tip: eat off of a salad plate instead of a dinner plate.  2) Eat a healthy clean diet.   TRY TO EAT: -at least 5-7 servings of low sugar vegetables per day (not corn, potatoes or bananas.) -berries are the best choice if you wish to eat fruit.   -lean meets (fish, chicken or [place] breasts) -vegan proteins for some meals - beans or tofu, whole grains, nuts and seeds -Replace bad fats with good fats - good fats include: fish, nuts and seeds, canola oil, olive oil -small  amounts of low fat or non fat dairy -small amounts of100 % whole grains - check the lables  AVOID: -SUGAR, sweets, anything with added sugar, corn syrup or sweeteners -if you must have a sweetener, small amounts of stevia may be best -sweetened beverages -simple starches (rice, bread, potatoes, pasta, chips, etc - small amounts of 100% whole grains are ok) -red meat, pork, butter -fried foods, fast food, processed food, excessive dairy, eggs and coconut.  3)Get at least 150 minutes of sweaty aerobic exercise per week.  4)Reduce stress - consider counseling, meditation and relaxation to balance other aspects of your life.     Colin Benton R., DO

## 2016-07-28 LAB — CBC
HEMATOCRIT: 41 % (ref 39.0–52.0)
HEMOGLOBIN: 13.8 g/dL (ref 13.0–17.0)
MCHC: 33.5 g/dL (ref 30.0–36.0)
MCV: 81 fl (ref 78.0–100.0)
Platelets: 251 10*3/uL (ref 150.0–400.0)
RBC: 5.07 Mil/uL (ref 4.22–5.81)
RDW: 13.2 % (ref 11.5–15.5)
WBC: 4.2 10*3/uL (ref 4.0–10.5)

## 2016-07-28 LAB — BASIC METABOLIC PANEL
BUN: 15 mg/dL (ref 6–23)
CHLORIDE: 100 meq/L (ref 96–112)
CO2: 27 meq/L (ref 19–32)
Calcium: 10 mg/dL (ref 8.4–10.5)
Creatinine, Ser: 1.14 mg/dL (ref 0.40–1.50)
GFR: 88.33 mL/min (ref >60.00)
Glucose, Bld: 86 mg/dL (ref 70–99)
Potassium: 4 mEq/L (ref 3.5–5.1)
Sodium: 135 mEq/L (ref 135–145)

## 2016-07-30 DIAGNOSIS — R3915 Urgency of urination: Secondary | ICD-10-CM | POA: Diagnosis not present

## 2016-09-23 ENCOUNTER — Other Ambulatory Visit: Payer: Self-pay | Admitting: Family Medicine

## 2016-09-24 ENCOUNTER — Other Ambulatory Visit: Payer: Self-pay | Admitting: Family Medicine

## 2016-10-01 ENCOUNTER — Encounter: Payer: Self-pay | Admitting: Family Medicine

## 2016-12-01 ENCOUNTER — Encounter: Payer: Self-pay | Admitting: Family Medicine

## 2016-12-01 ENCOUNTER — Telehealth: Payer: Self-pay | Admitting: Family Medicine

## 2016-12-01 NOTE — Telephone Encounter (Signed)
I left a message for the pt to return my call. 

## 2016-12-01 NOTE — Telephone Encounter (Signed)
We will need to know his settings from his last sleep study.  Can give verbal order [facility] to [facility], but he may need reevaluation.  This would to be done with pulmonology if so.

## 2016-12-01 NOTE — Telephone Encounter (Signed)
Ok to send referral to [facility] and let pt know. May need retesting to establish settings. O/w pt needs appt here and he needs to bring prior sleep study with recs/settings.

## 2016-12-01 NOTE — Telephone Encounter (Signed)
Copied from [place] [id number]. Topic: General - Other >> Dec 01, 2016  8:14 AM Scherrie Gerlach wrote: Reason for CRM: pt states his CPAP is not working anymore, not pushing air. Pt states last one was from his pcp and got from [facility]. Pt needs the machine. Pt wants today so he can sleep tonight.  >> Dec 01, 2016 10:45 AM Carolyn Stare wrote:    Pt call back with the fax number to [facility] for his cpap machine    Fax number [phone]

## 2016-12-01 NOTE — Telephone Encounter (Signed)
Pt stated he has had 2 sleep studies (03/12/05 and 08/12/06). He does not know his rec/settings. He called [facility] but did not have rx for a new machine. Appt made with Dr Regis Bill (PCP not on schedule).

## 2016-12-01 NOTE — Telephone Encounter (Signed)
Copied from [place] [id number]. Topic: General - Other >> Dec 01, 2016  8:14 AM Scherrie Gerlach wrote: Reason for CRM: pt states his CPAP is not working anymore, not pushing air. Pt states last one was from his pcp about 8 yrs and got from [facility]. Pt needs the machine. Pt wants today so he can sleep tonight.

## 2016-12-01 NOTE — Telephone Encounter (Signed)
I called [facility] at [phone] and left a message on the voicemail for Corene Cornea at ext [phone] to call me in regards to a verbal order for CPAP per the message below.

## 2016-12-01 NOTE — Telephone Encounter (Signed)
Corene Cornea called back and stated he reviewed the pts chart in Epic and he would need a recent office visit showing that he is using the CPAP and benefiting from use, an order and also the settings that would be needed.  Corene Cornea stated this is typically done by a pulmonologist and is Dr Maudie Mercury does not feel comfortable doing this, the pt should be set up with a pulmonary doctor. Message sent to Dr Maudie Mercury.

## 2016-12-01 NOTE — Telephone Encounter (Signed)
This encounter was created in error - please disregard.

## 2016-12-02 ENCOUNTER — Ambulatory Visit: Payer: 59 | Admitting: Family Medicine

## 2016-12-02 ENCOUNTER — Encounter: Payer: Self-pay | Admitting: Family Medicine

## 2016-12-02 VITALS — BP 120/70 | HR 60 | Temp 98.8°F | Wt 270.3 lb

## 2016-12-02 DIAGNOSIS — G4733 Obstructive sleep apnea (adult) (pediatric): Secondary | ICD-10-CM

## 2016-12-02 NOTE — Progress Notes (Signed)
Subjective:     Patient ID: Samuel Wiley, male   DOB: September 04, 1970, 46 y.o.   MRN: [id number]  HPI Patient has long-standing history of obstructive sleep apnea. Thought he had appointment today with another provider but there was some kind of miscommunication we are basically working him in to be seen.   His current CPAP machine (which he states he's had for over 10 years) broke about 3 days ago. He [facility] but would like to look at other potential options. He's on CPAP setting of 18. He has 100% compliance. He has seen consistent benefits when using CPAP.   Since machine broke 3 days ago, has had much more fatigue and daytime somnolence  He is noted in the past his blood pressure tends to go up considerably when not treated (with CPAP). He states he uses a Psychologist, clinical  Past Medical History:  Diagnosis Date  . Anxiety and depression    looses weight with episodes, advised to see psychiatrist 01/2014  . Asthma    childhood  . Chicken pox   . Diastolic dysfunction 96/78/9381   Grade 2 diastolic dysfunction on echo 08/2012.  Marland Kitchen GERD (gastroesophageal reflux disease)   . Heart palpitations   . History of echocardiogram    (5/09) showed EF 50-55%, mild LV dilation, mild LV hypertrophy, pseudonormal  diastolic function, mild mitral regurgitation, and mild left atrial enlargement.;  Echo 8/14:  Mild LVH, EF 55-60%, Gr 2 DD, mild MR, mild LAE  . Hypertension    high blood pressure readings   . Mild mitral regurgitation by prior echocardiogram   . Obesity   . Obstructive sleep apnea   . Perioperative myocardial infarction    Perioperative troponin elevation in 2007 after the patient's uvuloplasty.  He did have some chest pain at the time as well.  Left heart catheterization at that time showed normal coronary arteries and normal renal  arteries, EF was 50%.   Past Surgical History:  Procedure Laterality Date  . CARDIAC  CATHETERIZATION     EF of 50% with Normal coronary arteries, normal renal arteries  . MASS EXCISION Left 10/09/2014   Procedure: EXCISION LEFT NECK LIPOMA;  Surgeon: Rozetta Nunnery, MD;  Location: [place];  Service: ENT;  Laterality: Left;    reports that  has never smoked. he has never used smokeless tobacco. He reports that he drinks alcohol. His drug history is not on file. family history includes Diabetes in his mother; Hypertension in his father and mother. Allergies  Allergen Reactions  . Milk Protein   . Clavulanic Acid Diarrhea     Review of Systems  Constitutional: Negative for fatigue.  Eyes: Negative for visual disturbance.  Respiratory: Negative for cough, chest tightness and shortness of breath.   Cardiovascular: Negative for chest pain, palpitations and leg swelling.  Neurological: Negative for dizziness, syncope, weakness, light-headedness and headaches.       Objective:   Physical Exam  Constitutional: He is oriented to person, place, and time. He appears well-developed and well-nourished.  HENT:  Right Ear: External ear normal.  Left Ear: External ear normal.  Mouth/Throat: Oropharynx is clear and moist.  Eyes: Pupils are equal, round, and reactive to light.  Neck: Neck supple. No thyromegaly present.  Cardiovascular: Normal rate and regular rhythm.  Pulmonary/Chest: Effort normal and breath sounds normal. No respiratory distress. He has no wheezes. He has no rales.  Musculoskeletal: He exhibits no  edema.  Neurological: He is alert and oriented to person, place, and time.       Assessment:     Obstructive sleep apnea. Patient needing new equipment    Plan:     -Place order for new machine -We have suggested he consider evaluation for CPAP titration study as he has not been studied for over 10 years  Eulas Post MD [facility]

## 2016-12-02 NOTE — Patient Instructions (Signed)

## 2016-12-02 NOTE — Telephone Encounter (Signed)
Looks like there is an old CPAP study report in the chart. We could order with those settings if he can do an appt with me to assess per [facility] requests for documentation. Thanks.

## 2016-12-02 NOTE — Telephone Encounter (Signed)
Spoke with patient and he will drop of the form for [facility] on Monday when it has been completed.

## 2016-12-02 NOTE — Telephone Encounter (Signed)
Patient came into the office today to assess for a CPAP with Dr Sheldon Silvan.  He was told that he could order a CPAP through [facility] on line by one of our staff.  Patient was interested in this or ordering through [facility].  There is a form that will need to be filled out by the patient if he wants to use [facility], or we could place an order in the computer for [place].  Left message on machine for patient to return our call for more information.

## 2016-12-02 NOTE — Telephone Encounter (Deleted)
Spoke with patient and he will drop of the form Monday when it is completed.

## 2016-12-04 NOTE — Progress Notes (Signed)
Thanks so much for seeing Samuel Wiley. Sorry this fell on you. He was supposed to have appt with me about this to satisfy needed documentation for order - but looks like he couldn't wait.  Hope you had a good thanksgiving.

## 2016-12-08 ENCOUNTER — Ambulatory Visit: Payer: 59 | Admitting: Family Medicine

## 2016-12-10 NOTE — Telephone Encounter (Signed)
Pt saw Dr Elease Hashimoto on 12/02/2016 for CPAP compliance

## 2016-12-29 ENCOUNTER — Other Ambulatory Visit: Payer: Self-pay | Admitting: Family Medicine

## 2016-12-30 ENCOUNTER — Telehealth: Payer: Self-pay | Admitting: Family Medicine

## 2016-12-30 DIAGNOSIS — G4733 Obstructive sleep apnea (adult) (pediatric): Secondary | ICD-10-CM

## 2016-12-30 NOTE — Telephone Encounter (Signed)
Copied from [place] [phone]. Topic: General - Other >> Dec 30, 2016  9:48 AM Patrice Paradise wrote: Reason for CRM: Patient is requesting a call back from Dr. Maudie Mercury assistant. The reason for the call is he would like to come in around 3 pm today to sign ppwk for his cpap. Patient would like everything really when he get there. Please call patient asap.  Patient recently was seen by Dr. Elease Hashimoto.

## 2016-12-30 NOTE — Telephone Encounter (Addendum)
Patient returned the telephone call from Dr. Burnett Kanaris assistant.  He would like a return call.

## 2016-12-30 NOTE — Telephone Encounter (Signed)
Spoke with pt and explained process for [email or web address]. He now understands that he must contact them and complete application process. He does need order for new CPAP machine to include tubing and mask along with pressure settings. He would like to expedite this process as [place].  Dr. Elease Hashimoto - is this something you can complete or does this need to go to Dr. Maudie Mercury? Thanks!

## 2016-12-30 NOTE — Telephone Encounter (Signed)
Left message on machine for patient to return our call.   When patient is finished completing the paperwork, he can drop it off and Dr Elease Hashimoto will help order the supplies needed.

## 2016-12-30 NOTE — Telephone Encounter (Signed)
Copied from [place]. Topic: Quick Communication - Office Called Patient >> Dec 30, 2016 11:31 AM Lamarr Lulas, CMA wrote: Reason for CRM: Left message on machine for patient returning his call.  If patient would like to drop off paperwork, then Dr Elease Hashimoto may complete the paperwork after seeing patients tonight. >> Dec 30, 2016 11:40 AM Synthia Innocent wrote: Patient called and states we already have the paperwork, he does not have

## 2016-12-30 NOTE — Telephone Encounter (Signed)
Left message on machine for patient returning his call.  If patient would like to drop off paperwork, then Dr Elease Hashimoto may complete the paperwork after seeing patients tonight.

## 2016-12-30 NOTE — Telephone Encounter (Signed)
Copied from [place]. Topic: Quick Communication - Office Called Patient >> Dec 30, 2016 11:40 AM Synthia Innocent wrote: Patient called and states we already have the paperwork, he does not have it  Pt says that he talked to someone here in our office about ordering a CPAP machine.

## 2016-12-31 NOTE — Telephone Encounter (Signed)
Phone nummber for Cpap supplier P: [phone]

## 2016-12-31 NOTE — Telephone Encounter (Signed)
Pt. States all he needs is a prescription for his CPAP machine with the setting of 18. He would like to pick it up today if possible.

## 2016-12-31 NOTE — Telephone Encounter (Signed)
Copied from [place] [phone]. Topic: Quick Communication - Rx Refill/Question >> Dec 31, 2016 11:04 AM Marin Olp L wrote: Has the patient contacted their pharmacy? Yes.   (Agent: If no, request that the patient contact the pharmacy for the refill.) Preferred Pharmacy (with phone number or street name): [email or web address] Fax: [phone] Agent: Please be advised that RX refills may take up to 3 business days. We ask that you follow-up with your pharmacy. Refill cpap machine with supplies. Patient has been calling about this for a while and there is no paperwork to drop off. He was advised he just needs a script. Westley Hummer called him and indicated for him to drop off ppw, but he just needs a script. The machine broke last month and Dr. Sinclair Ship saw him about it but he's a patient of Bl[facility] Singer.

## 2017-01-01 NOTE — Addendum Note (Signed)
Addended by: Westley Hummer B on: 01/01/2017 08:14 AM   Modules accepted: Orders

## 2017-01-01 NOTE — Telephone Encounter (Signed)
rx ready for pick up and Left message on machine.

## 2017-01-15 DIAGNOSIS — R300 Dysuria: Secondary | ICD-10-CM | POA: Diagnosis not present

## 2017-01-21 ENCOUNTER — Encounter: Payer: Self-pay | Admitting: Family Medicine

## 2017-02-03 ENCOUNTER — Other Ambulatory Visit: Payer: Self-pay | Admitting: Family Medicine

## 2017-03-09 ENCOUNTER — Telehealth: Payer: Self-pay | Admitting: *Deleted

## 2017-03-09 NOTE — Telephone Encounter (Signed)
[facility] faxed a note stating the pt is requesting a new Rx.  Patient is paying $42 for Bysto 5mg  and requesting lower-cost alternative.  Ateno 50mg  is $10 and Metop 50mg  is $19.  Message sent to Dr Maudie Mercury.

## 2017-03-10 NOTE — Telephone Encounter (Signed)
These are not the same medications. They may cause more fatigue. But, if he wishes to switch ok to send metoprolol 25mg  bid. #60, 3 RF with follow up in 1 month to check BP and see how he is doing with the change. Thanks.

## 2017-03-11 MED ORDER — METOPROLOL TARTRATE 25 MG PO TABS: 25.0000 mg | ORAL_TABLET | Freq: Two times a day (BID) | ORAL | 3 refills | Status: DC

## 2017-03-11 NOTE — Telephone Encounter (Signed)
Unable to reach the pt as a message states he is not accepting calls.  I called [facility] and spoke with the pharmacist Nevin Bloodgood, informed her of the message below and she was given a verbal order for the new Rx. Nevin Bloodgood stated she will inform the pt he needs an appt also.

## 2017-03-14 ENCOUNTER — Other Ambulatory Visit: Payer: Self-pay | Admitting: Family Medicine

## 2017-03-26 ENCOUNTER — Telehealth: Payer: Self-pay | Admitting: *Deleted

## 2017-03-26 MED ORDER — NEBIVOLOL HCL 5 MG PO TABS: 5.0000 mg | ORAL_TABLET | Freq: Every day | ORAL | 5 refills | Status: DC

## 2017-03-26 NOTE — Telephone Encounter (Signed)
Copied from [place] [phone]. Topic: General - Other >> Mar 25, 2017  4:18 PM Marin Olp L wrote: Reason for CRM: Patient would like a call back from Dr. Maudie Mercury cma to discuss prescriptions. Did not wish to disclose anything else.

## 2017-03-26 NOTE — Telephone Encounter (Signed)
I called the pt and he stated his pharmacy told him we changed the prescriptions.  I advised the pt the pharmacy faxed a note to [place] stating he requested the change due to cost and I tried reaching him, was unable to do so and the pharmacist stated they would inform him.  Patient stated he did not request this, has plenty of money, does not want to change and would like to begin Bystolic again.  Dr Maudie Mercury was informed of this and approved the change.  Rx was sent to the pts pharmacy and I called the pt and informed him of this.  I also advised he is due for a follow up appt and he stated he will check his schedule and call back for an appt. Sherryle Lis      Copied from [place] [phone]. Topic: General - Other >> Mar 25, 2017  4:18 PM Marin Olp L wrote: Reason for CRM: Patient would like a call back from Dr. Maudie Mercury cma to discuss prescriptions. Did not wish to disclose anything else.

## 2017-04-03 ENCOUNTER — Other Ambulatory Visit: Payer: Self-pay | Admitting: Family Medicine

## 2017-04-11 IMAGING — US US SOFT TISSUE HEAD/NECK
1 series · 14 of 17 positions shown · non-contrast
Comparison: None.

CLINICAL DATA: Palpable soft tissue mass in the left
supraclavicular region for roughly 3 months.

EXAM:
ULTRASOUND OF HEAD/NECK SOFT TISSUES
TECHNIQUE: Ultrasound examination of the head and neck soft tissues was
performed in the area of clinical concern.

[Series 1: us soft tissue head/neck · 0.08mm/px · 17 acquisitions, 14 frames shown]
[im 1/17]
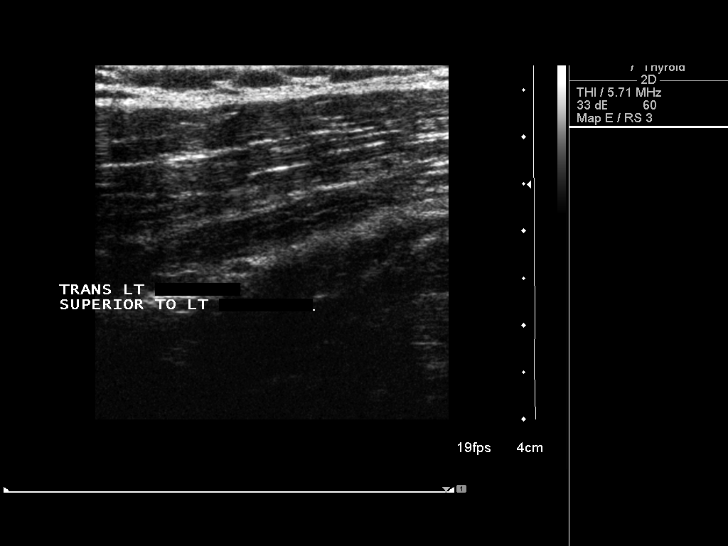
[im 2/17]
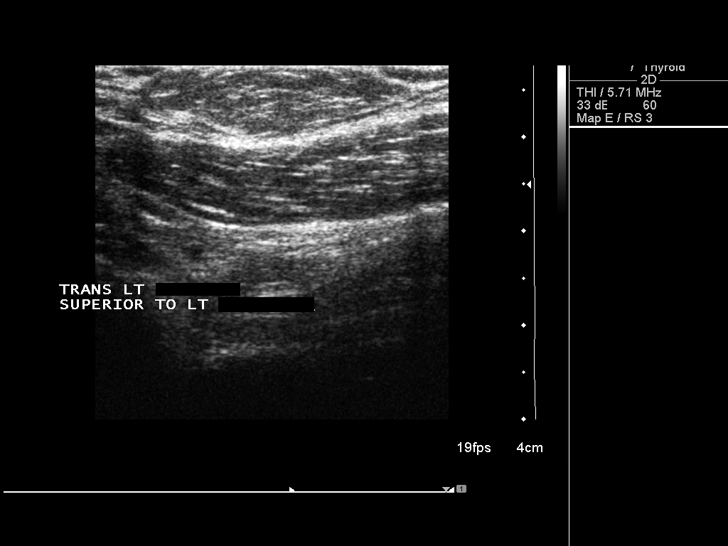
[im 4/17]
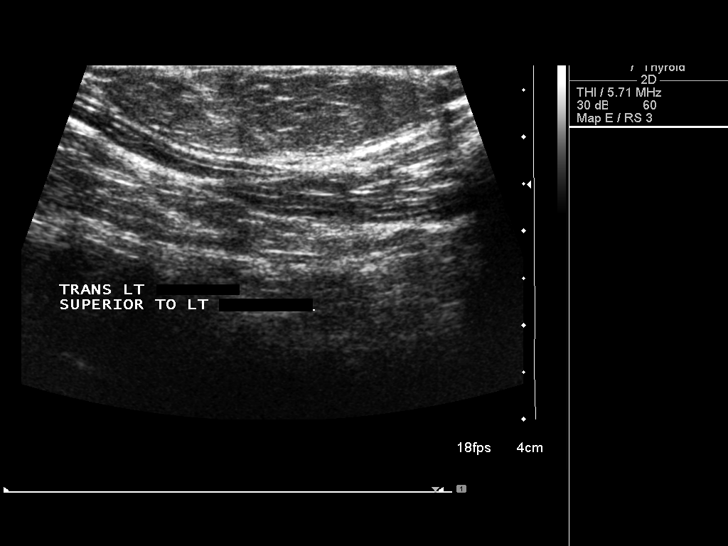
[im 5/17]
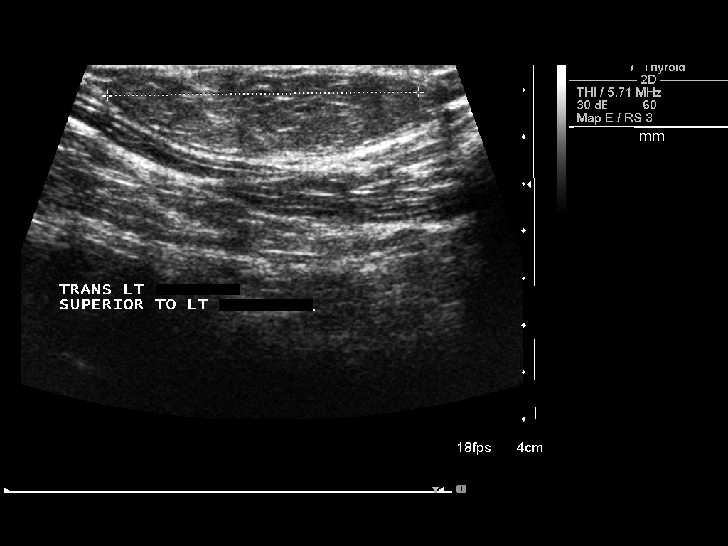
[im 6/17]
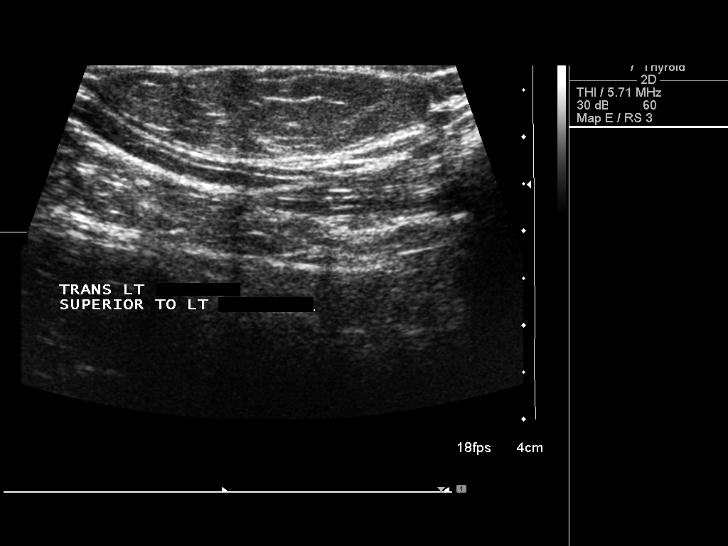
[im 7/17]
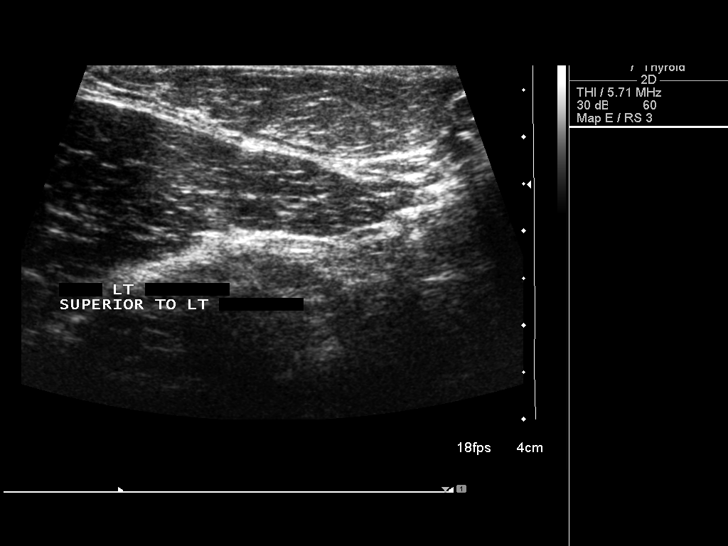
[im 8/17]
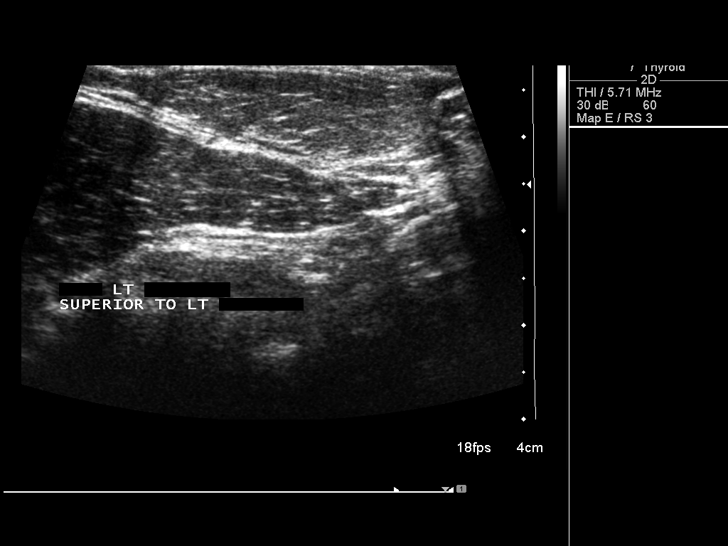
[im 10/17]
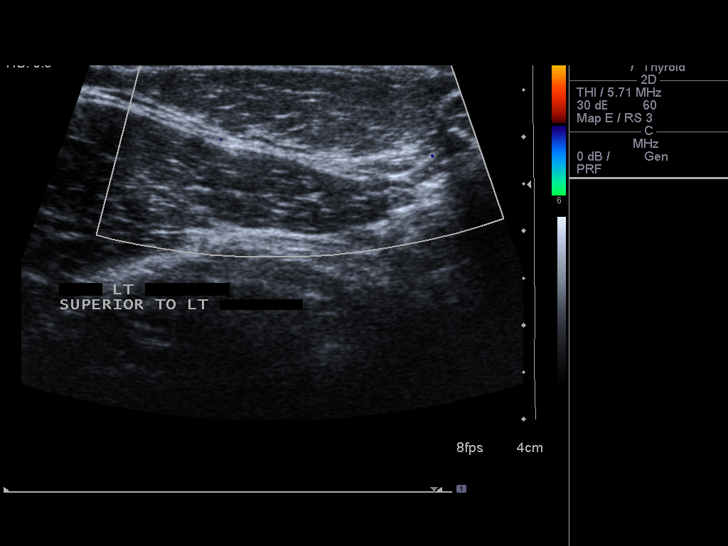
[im 11/17]
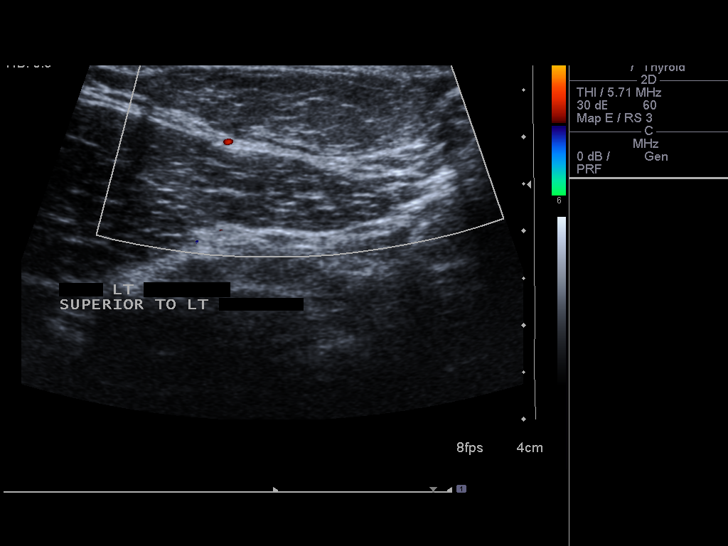
[im 12/17]
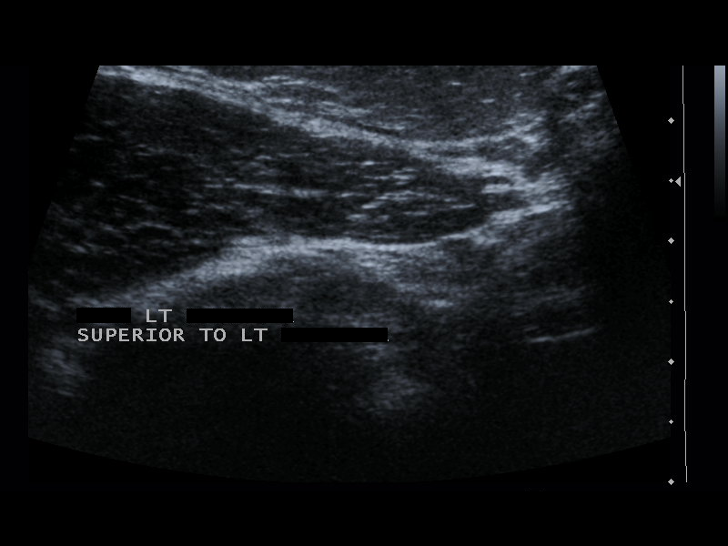
[im 13/17]
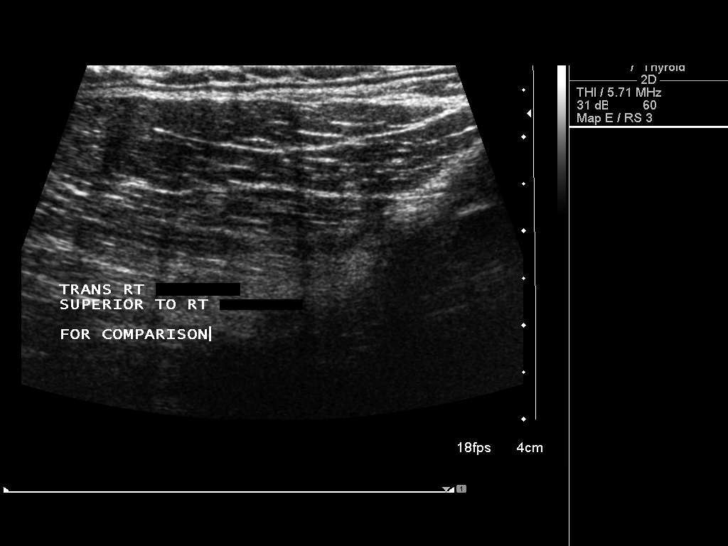
[im 14/17]
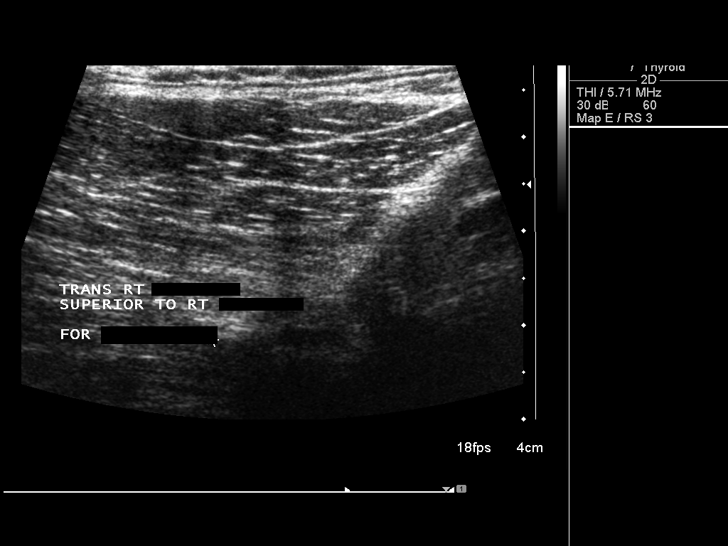
[im 16/17]
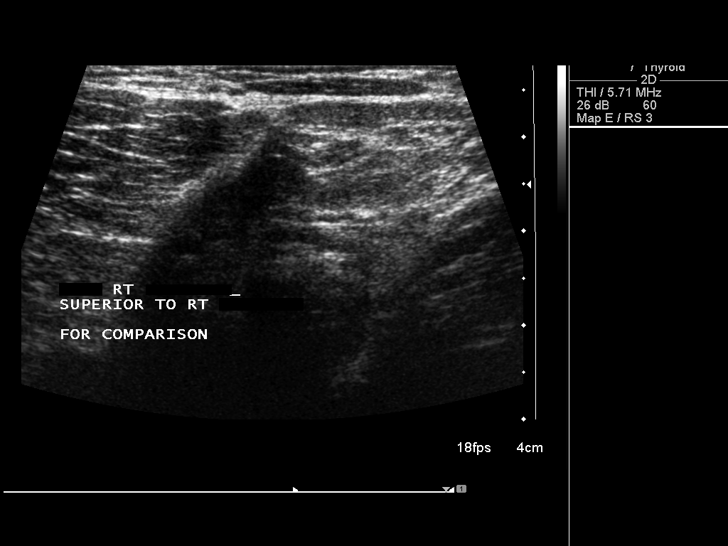
[im 17/17]
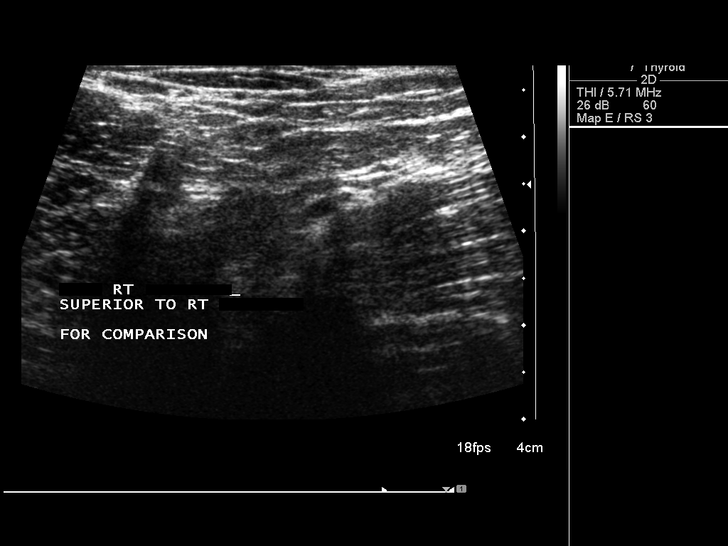

[14 of 17 positions shown; findings below may reference images not displayed]

FINDINGS: Imaging in the region of the lateral left supraclavicular region
reveals soft tissue asymmetry compared to the contralateral right
side with ovoid area of relatively hypoechoic soft tissue measuring
roughly 3.0 x 0.9 x 3.3 cm. This is felt to most likely represent a
lipoma. However, ultrasound characteristics of lipomas are not
consistent and can be quite variable. No enlarged lymph nodes or
abnormal fluid collections are identified.
IMPRESSION: Soft tissue asymmetry measuring roughly 3 cm in the left
supraclavicular region. This is felt to most likely represent a
lipoma. Correlation suggested with clinical exam. Ultrasound
characteristics of lipomas are variable. If further imaging is
needed for tissue characterization, MRI would be the most sensitive
and specific in depicting a lipoma.

## 2017-04-12 ENCOUNTER — Other Ambulatory Visit: Payer: Self-pay | Admitting: Family Medicine

## 2017-05-03 ENCOUNTER — Other Ambulatory Visit: Payer: Self-pay | Admitting: Family Medicine

## 2017-06-06 ENCOUNTER — Other Ambulatory Visit: Payer: Self-pay | Admitting: Family Medicine

## 2017-06-15 ENCOUNTER — Other Ambulatory Visit: Payer: Self-pay | Admitting: Family Medicine

## 2017-06-22 ENCOUNTER — Other Ambulatory Visit: Payer: Self-pay | Admitting: Family Medicine

## 2017-06-24 MED ORDER — AMLODIPINE BESYLATE 10 MG PO TABS: 10.0000 mg | ORAL_TABLET | Freq: Every day | ORAL | 0 refills | Status: DC

## 2017-06-24 NOTE — Addendum Note (Signed)
Addended by: Agnes Lawrence on: 06/24/2017 07:22 AM   Modules accepted: Orders

## 2017-06-30 ENCOUNTER — Ambulatory Visit (INDEPENDENT_AMBULATORY_CARE_PROVIDER_SITE_OTHER): Payer: 59 | Admitting: Pulmonary Disease

## 2017-06-30 DIAGNOSIS — G4733 Obstructive sleep apnea (adult) (pediatric): Secondary | ICD-10-CM

## 2017-07-02 NOTE — Progress Notes (Signed)
Consult cancelled at patients request.

## 2017-07-13 DIAGNOSIS — R3915 Urgency of urination: Secondary | ICD-10-CM | POA: Diagnosis not present

## 2017-07-13 DIAGNOSIS — Z125 Encounter for screening for malignant neoplasm of prostate: Secondary | ICD-10-CM | POA: Diagnosis not present

## 2017-07-25 ENCOUNTER — Other Ambulatory Visit: Payer: Self-pay | Admitting: Family Medicine

## 2017-08-10 ENCOUNTER — Encounter

## 2017-08-10 ENCOUNTER — Ambulatory Visit (INDEPENDENT_AMBULATORY_CARE_PROVIDER_SITE_OTHER): Payer: 59 | Admitting: Family Medicine

## 2017-08-10 ENCOUNTER — Encounter: Payer: Self-pay | Admitting: Family Medicine

## 2017-08-10 VITALS — BP 118/80 | HR 60 | Temp 99.0°F | Ht 69.0 in | Wt 264.2 lb

## 2017-08-10 DIAGNOSIS — Z0000 Encounter for general adult medical examination without abnormal findings: Secondary | ICD-10-CM

## 2017-08-10 DIAGNOSIS — I10 Essential (primary) hypertension: Secondary | ICD-10-CM

## 2017-08-10 DIAGNOSIS — R739 Hyperglycemia, unspecified: Secondary | ICD-10-CM

## 2017-08-10 LAB — BASIC METABOLIC PANEL
BUN: 14 mg/dL (ref 6–23)
CHLORIDE: 102 meq/L (ref 96–112)
CO2: 30 mEq/L (ref 19–32)
CREATININE: 1.08 mg/dL (ref 0.40–1.50)
Calcium: 9.6 mg/dL (ref 8.4–10.5)
GFR: 93.61 mL/min (ref >60.00)
Glucose, Bld: 89 mg/dL (ref 70–99)
POTASSIUM: 4.2 meq/L (ref 3.5–5.1)
Sodium: 138 mEq/L (ref 135–145)

## 2017-08-10 LAB — LIPID PANEL
Cholesterol: 127 mg/dL (ref 0–200)
HDL: 41.9 mg/dL (ref >39.00)
LDL Cholesterol: 78 mg/dL (ref 0–99)
NONHDL: 84.79
TRIGLYCERIDES: 35 mg/dL (ref 0.0–149.0)
Total CHOL/HDL Ratio: 3
VLDL: 7 mg/dL (ref 0.0–40.0)

## 2017-08-10 LAB — CBC
HCT: 43.4 % (ref 39.0–52.0)
HEMOGLOBIN: 14.4 g/dL (ref 13.0–17.0)
MCHC: 33.1 g/dL (ref 30.0–36.0)
MCV: 82.6 fl (ref 78.0–100.0)
Platelets: 185 10*3/uL (ref 150.0–400.0)
RBC: 5.25 Mil/uL (ref 4.22–5.81)
RDW: 14 % (ref 11.5–15.5)
WBC: 3.8 10*3/uL — AB (ref 4.0–10.5)

## 2017-08-10 LAB — HEMOGLOBIN A1C: HEMOGLOBIN A1C: 6.4 % (ref 4.6–6.5)

## 2017-08-10 NOTE — Progress Notes (Signed)
HPI:  Using dictation device. Unfortunately this device frequently misinterprets words/phrases.  Here for CPE and follow up: Not seen in some time.  Poor follow-up compliance since last visit.  Chronic medical problems summarized below were reviewed for changes and stability and were updated as needed below. These issues and their treatment remain stable for the most part.  He discontinued some of his blood pressure medications, updated below.  He is not getting any regular exercise, but he does work outside in the garden and yard on a regular basis. He only eats one meal a day in the evening. Denies CP, SOB, DOE, treatment intolerance (except thought arb cause fatigue and cough, he discontinued) or new symptoms.   Prediabetes/Obesity:   -advised lifestyle changes   Hyperlipidemia:  -resolved with lifestyle changes  Resistant HTN/LVH, OSA:  -meds:norvasc 10, nebivolol 5, terazosin - reports stoppe dthe arb as thought caused a cough and lethargy -managed by his cardiologist, but did not follow up for annual follow up -s/p normal (per cardiology note) myocardial perfusion stress test for CP 12/2015 -saw pulm about CPAP -denies: CP, sob, swelling, syncope   ED:  -followed by [facility]  -denies: priapism, LUTS worsening  Hx anxiety and Depression -advised psychiatry care in 2016 -reports doing well   -Diet: variety of foods, balance and well rounded, larger portion sizes -Exercise: no regular exercise -Diabetes and Dyslipidemia Screening: Due for metabolic labs -Hx of HTN: no -Vaccines: UTD -sexual activity: yes, male partner, no new partners -wants STI testing, Hep C screening (if born 55-1967): no -FH colon or prstate ca: see FH Last colon cancer screening: Not applicable Last prostate ca screening: Not applicable -Alcohol, Tobacco, drug use: see social history  Review of Systems - no reported fevers, unintentional weight loss, vision loss, hearing  loss, chest pain, sob, hemoptysis, melena, hematochezia, hematuria, genital discharge, changing or concerning skin lesions, bleeding, bruising, loc, thoughts of self harm or SI. He  reports he had a mild depression early this year, but now is feeling good.  Past Medical History:  Diagnosis Date  . Anxiety and depression    looses weight with episodes, advised to see psychiatrist 01/2014  . Asthma    childhood  . Chicken pox   . Diastolic dysfunction 32/35/5732   Grade 2 diastolic dysfunction on echo 08/2012.  Marland Kitchen GERD (gastroesophageal reflux disease)   . Heart palpitations   . History of echocardiogram    (5/09) showed EF 50-55%, mild LV dilation, mild LV hypertrophy, pseudonormal  diastolic function, mild mitral regurgitation, and mild left atrial enlargement.;  Echo 8/14:  Mild LVH, EF 55-60%, Gr 2 DD, mild MR, mild LAE  . Hypertension    high blood pressure readings   . Mild mitral regurgitation by prior echocardiogram   . Obesity   . Obstructive sleep apnea   . Perioperative myocardial infarction    Perioperative troponin elevation in 2007 after the patient's uvuloplasty.  He did have some chest pain at the time as well.  Left heart catheterization at that time showed normal coronary arteries and normal renal  arteries, EF was 50%.    Past Surgical History:  Procedure Laterality Date  . CARDIAC CATHETERIZATION     EF of 50% with Normal coronary arteries, normal renal arteries  . MASS EXCISION Left 10/09/2014   Procedure: EXCISION LEFT NECK LIPOMA;  Surgeon: Rozetta Nunnery, MD;  Location: [place];  Service: ENT;  Laterality: Left;    Family History  Problem Relation  Age of Onset  . Hypertension Father        family history of early onset  . Hypertension Mother   . Diabetes Mother     Social History   Socioeconomic History  . Marital status: Single    Spouse name: Not on file  . Number of children: Not on file  . Years of education: Not on file  .  Highest education level: Not on file  Occupational History  . Not on file  Social Needs  . Financial resource strain: Not on file  . Food insecurity:    Worry: Not on file    Inability: Not on file  . Transportation needs:    Medical: Not on file    Non-medical: Not on file  Tobacco Use  . Smoking status: Never Smoker  . Smokeless tobacco: Never Used  Substance and Sexual Activity  . Alcohol use: Yes    Comment: few drinks occ  . Drug use: Not on file  . Sexual activity: Not on file  Lifestyle  . Physical activity:    Days per week: Not on file    Minutes per session: Not on file  . Stress: Not on file  Relationships  . Social connections:    Talks on phone: Not on file    Gets together: Not on file    Attends religious service: Not on file    Active member of club or organization: Not on file    Attends meetings of clubs or organizations: Not on file    Relationship status: Not on file  Other Topics Concern  . Not on file  Social History Narrative   Work or School: Radio broadcast assistant Situation: lives alone      Spiritual Beliefs: none      Lifestyle: intermittent weight lifting and gym; diet is bad              Current Outpatient Medications:  .  albuterol (PROAIR HFA) 108 (90 Base) MCG/ACT inhaler, Inhale 2 puffs into the lungs every 6 (six) hours as needed for wheezing or shortness of breath., Disp: 1 Inhaler, Rfl: 0 .  amLODipine (NORVASC) 10 MG tablet, TAKE 1 TABLET BY MOUTH EVERY DAY, Disp: 30 tablet, Rfl: 0 .  arginine 500 MG tablet, Take 1,000 mg by mouth 3 (three) times daily. , Disp: , Rfl:  .  aspirin 81 MG tablet, Take 81 mg by mouth daily., Disp: , Rfl:  .  Citrulline 600 MG CAPS, Take 1,000 mg by mouth 2 (two) times a week. , Disp: , Rfl:  .  Coenzyme Q10 (COQ10) 100 MG CAPS, Take 1 capsule by mouth daily., Disp: , Rfl:  .  nebivolol (BYSTOLIC) 5 MG tablet, Take 1 tablet (5 mg total) by mouth daily., Disp: 30 tablet, Rfl: 5 .   Sildenafil Citrate (VIAGRA PO), Take by mouth as needed., Disp: , Rfl:  .  terazosin (HYTRIN) 10 MG capsule, Take 10 mg by mouth daily., Disp: , Rfl:   EXAM:  Vitals:   08/10/17 1411  BP: 118/80  Pulse: 60  Temp: 99 F (37.2 C)  TempSrc: Oral  Weight: 264 lb 3.2 oz (119.8 kg)  Height: '5\' 9"'  (1.753 m)    Estimated body mass index is 39.02 kg/m as calculated from the following:   Height as of this encounter: '5\' 9"'  (1.753 m).   Weight as of this encounter: 264 lb 3.2 oz (119.8 kg).  GENERAL: vitals reviewed  and listed below, alert, oriented, appears well hydrated and in no acute distress  HEENT: head atraumatic, PERRLA, normal appearance of eyes, ears, nose and mouth. moist mucus membranes.  NECK: supple, no masses or lymphadenopathy  LUNGS: clear to auscultation bilaterally, no rales, rhonchi or wheeze  CV: HRRR, no peripheral edema or cyanosis, normal pedal pulses  ABDOMEN: bowel sounds normal, soft, non tender to palpation, no masses, no rebound or guarding  GU: sees urology for this  SKIN: no rash or abnormal lesions  MS: normal gait, moves all extremities normally  NEURO: normal gait, speech and thought processing grossly intact, muscle tone grossly intact throughout  PSYCH: normal affect, pleasant and cooperative  ASSESSMENT AND PLAN:   PREVENTIVE EXAM: -Discussed and advised all [place] preventive services health task force level A and B recommendations for age, sex and risks. -Advised at least 150 minutes of exercise per week and a healthy diet - sugested 3 smaller healthy meals per day rather then one large meal. -labs, studies and vaccines per orders this encounter  2. Essential hypertension -updated meds, encouraged healthier lifestyle - Basic metabolic panel - CBC  3. Hyperglycemia - Hemoglobin A1c  4. Morbid obesity ([place]) -lifestyle recs - see summary handout - Lipid panel  Patient Instructions  BEFORE YOU LEAVE: -labs -number for cardiology - I  think he is about 6 months overdue for his yearly cardiology appointment on review of chart -follow up: 3-4 months  We have ordered labs or studies at this visit. It can take up to 1-2 weeks for results and processing. IF results require follow up or explanation, we will call you with instructions. Clinically stable results will be released to your [facility]. If you have not heard from [place] or cannot find your results in [facility] in 2 weeks please contact our office at [phone].  If you are not yet signed up for [facility], please consider signing up.  Eat a healthy low sugar diet. Get regular, small and healthy meals. Get at least 150 minutes per week of exercise.    We recommend the following healthy lifestyle for LIFE: 1) Small portions. But, make sure to get regular (at least 3 per day), healthy meals and small healthy snacks if needed.  2) Eat a healthy clean diet.   TRY TO EAT: -at least 5-7 servings of low sugar, colorful, and nutrient rich vegetables per day (not corn, potatoes or bananas.) -berries are the best choice if you wish to eat fruit (only eat small amounts if trying to reduce weight)  -lean meets (fish, white meat of chicken or [place]) -vegan proteins for some meals - beans or tofu, whole grains, nuts and seeds -Replace bad fats with good fats - good fats include: fish, nuts and seeds, canola oil, olive oil -small amounts of low fat or non fat dairy -small amounts of100 % whole grains - check the lables -drink plenty of water  AVOID: -SUGAR, sweets, anything with added sugar, corn syrup or sweeteners - must read labels as even foods advertised as "healthy" often are loaded with sugar -if you must have a sweetener, small amounts of stevia may be best -sweetened beverages and artificially sweetened beverages -simple starches (rice, bread, potatoes, pasta, chips, etc - small amounts of 100% whole grains are ok) -red meat, pork, butter -fried foods, fast food, processed food,  excessive dairy, eggs and coconut.  3)Get at least 150 minutes of sweaty aerobic exercise per week.  4)Reduce stress - consider counseling, meditation and relaxation to  balance other aspects of your life.    Preventive Care 40-64 Years, Male Preventive care refers to lifestyle choices and visits with your health care provider that can promote health and wellness. What does preventive care include?  A yearly physical exam. This is also called an annual well check.  Dental exams once or twice a year.  Routine eye exams. Ask your health care provider how often you should have your eyes checked.  Personal lifestyle choices, including: ? Daily care of your teeth and gums. ? Regular physical activity. ? Eating a healthy diet. ? Avoiding tobacco and drug use. ? Limiting alcohol use. ? Practicing safe sex. What happens during an annual well check? The services and screenings done by your health care provider during your annual well check will depend on your age, overall health, lifestyle risk factors, and family history of disease. Counseling Your health care provider may ask you questions about your:  Alcohol use.  Tobacco use.  Drug use.  Emotional well-being.  Home and relationship well-being.  Sexual activity.  Eating habits.  Work and work Statistician.  Screening You may have the following tests or measurements:  Height, weight, and BMI.  Blood pressure.  Lipid and cholesterol levels. These may be checked every 5 years, or more frequently if you are over 76 years old.  Skin check.  Lung cancer screening. You may have this screening every year starting at age 27 if you have a 30-pack-year history of smoking and currently smoke or have quit within the past 15 years.  Fecal occult blood test (FOBT) of the stool. You may have this test every year starting at age 10.  Flexible sigmoidoscopy or colonoscopy. You may have a sigmoidoscopy every 5 years or a  colonoscopy every 10 years starting at age 33.  Prostate cancer screening. Recommendations will vary depending on your family history and other risks.  Hepatitis C blood test.  Hepatitis B blood test.  Sexually transmitted disease (STD) testing.  Diabetes screening. This is done by checking your blood sugar (glucose) after you have not eaten for a while (fasting). You may have this done every 1-3 years.  Discuss your test results, treatment options, and if necessary, the need for more tests with your health care provider. Vaccines Your health care provider may recommend certain vaccines, such as:  Influenza vaccine. This is recommended every year.  Tetanus, diphtheria, and acellular pertussis (Tdap, Td) vaccine. You may need a Td booster every 10 years.  Varicella vaccine. You may need this if you have not been vaccinated.  Zoster vaccine. You may need this after age 22.  Measles, mumps, and rubella (MMR) vaccine. You may need at least one dose of MMR if you were born in 1957 or later. You may also need a second dose.  Pneumococcal 13-valent conjugate (PCV13) vaccine. You may need this if you have certain conditions and have not been vaccinated.  Pneumococcal polysaccharide (PPSV23) vaccine. You may need one or two doses if you smoke cigarettes or if you have certain conditions.  Meningococcal vaccine. You may need this if you have certain conditions.  Hepatitis A vaccine. You may need this if you have certain conditions or if you travel or work in places where you may be exposed to hepatitis A.  Hepatitis B vaccine. You may need this if you have certain conditions or if you travel or work in places where you may be exposed to hepatitis B.  Haemophilus influenzae type b (Hib) vaccine. You  may need this if you have certain risk factors.  Talk to your health care provider about which screenings and vaccines you need and how often you need them. This information is not intended to  replace advice given to you by your health care provider. Make sure you discuss any questions you have with your health care provider. Document Released: 01/25/2015 Document Revised: 09/18/2015 Document Reviewed: 10/30/2014 Elsevier Interactive Patient Education  2018 [employer].        No follow-ups on file.   Lucretia Kern, DO

## 2017-08-10 NOTE — Patient Instructions (Addendum)
BEFORE YOU LEAVE: -labs -number for cardiology - I think he is about 6 months overdue for his yearly cardiology appointment on review of chart -follow up: 3-4 months  We have ordered labs or studies at this visit. It can take up to 1-2 weeks for results and processing. IF results require follow up or explanation, we will call you with instructions. Clinically stable results will be released to your [facility]. If you have not heard from [place] or cannot find your results in [facility] in 2 weeks please contact our office at [phone].  If you are not yet signed up for [facility], please consider signing up.  Eat a healthy low sugar diet. Get regular, small and healthy meals. Get at least 150 minutes per week of exercise.    We recommend the following healthy lifestyle for LIFE: 1) Small portions. But, make sure to get regular (at least 3 per day), healthy meals and small healthy snacks if needed.  2) Eat a healthy clean diet.   TRY TO EAT: -at least 5-7 servings of low sugar, colorful, and nutrient rich vegetables per day (not corn, potatoes or bananas.) -berries are the best choice if you wish to eat fruit (only eat small amounts if trying to reduce weight)  -lean meets (fish, white meat of chicken or [place]) -vegan proteins for some meals - beans or tofu, whole grains, nuts and seeds -Replace bad fats with good fats - good fats include: fish, nuts and seeds, canola oil, olive oil -small amounts of low fat or non fat dairy -small amounts of100 % whole grains - check the lables -drink plenty of water  AVOID: -SUGAR, sweets, anything with added sugar, corn syrup or sweeteners - must read labels as even foods advertised as "healthy" often are loaded with sugar -if you must have a sweetener, small amounts of stevia may be best -sweetened beverages and artificially sweetened beverages -simple starches (rice, bread, potatoes, pasta, chips, etc - small amounts of 100% whole grains are ok) -red meat,  pork, butter -fried foods, fast food, processed food, excessive dairy, eggs and coconut.  3)Get at least 150 minutes of sweaty aerobic exercise per week.  4)Reduce stress - consider counseling, meditation and relaxation to balance other aspects of your life.    Preventive Care 40-64 Years, Male Preventive care refers to lifestyle choices and visits with your health care provider that can promote health and wellness. What does preventive care include?  A yearly physical exam. This is also called an annual well check.  Dental exams once or twice a year.  Routine eye exams. Ask your health care provider how often you should have your eyes checked.  Personal lifestyle choices, including: ? Daily care of your teeth and gums. ? Regular physical activity. ? Eating a healthy diet. ? Avoiding tobacco and drug use. ? Limiting alcohol use. ? Practicing safe sex. What happens during an annual well check? The services and screenings done by your health care provider during your annual well check will depend on your age, overall health, lifestyle risk factors, and family history of disease. Counseling Your health care provider may ask you questions about your:  Alcohol use.  Tobacco use.  Drug use.  Emotional well-being.  Home and relationship well-being.  Sexual activity.  Eating habits.  Work and work Statistician.  Screening You may have the following tests or measurements:  Height, weight, and BMI.  Blood pressure.  Lipid and cholesterol levels. These may be checked every 5 years, or  more frequently if you are over 65 years old.  Skin check.  Lung cancer screening. You may have this screening every year starting at age 21 if you have a 30-pack-year history of smoking and currently smoke or have quit within the past 15 years.  Fecal occult blood test (FOBT) of the stool. You may have this test every year starting at age 6.  Flexible sigmoidoscopy or colonoscopy. You  may have a sigmoidoscopy every 5 years or a colonoscopy every 10 years starting at age 65.  Prostate cancer screening. Recommendations will vary depending on your family history and other risks.  Hepatitis C blood test.  Hepatitis B blood test.  Sexually transmitted disease (STD) testing.  Diabetes screening. This is done by checking your blood sugar (glucose) after you have not eaten for a while (fasting). You may have this done every 1-3 years.  Discuss your test results, treatment options, and if necessary, the need for more tests with your health care provider. Vaccines Your health care provider may recommend certain vaccines, such as:  Influenza vaccine. This is recommended every year.  Tetanus, diphtheria, and acellular pertussis (Tdap, Td) vaccine. You may need a Td booster every 10 years.  Varicella vaccine. You may need this if you have not been vaccinated.  Zoster vaccine. You may need this after age 30.  Measles, mumps, and rubella (MMR) vaccine. You may need at least one dose of MMR if you were born in 1957 or later. You may also need a second dose.  Pneumococcal 13-valent conjugate (PCV13) vaccine. You may need this if you have certain conditions and have not been vaccinated.  Pneumococcal polysaccharide (PPSV23) vaccine. You may need one or two doses if you smoke cigarettes or if you have certain conditions.  Meningococcal vaccine. You may need this if you have certain conditions.  Hepatitis A vaccine. You may need this if you have certain conditions or if you travel or work in places where you may be exposed to hepatitis A.  Hepatitis B vaccine. You may need this if you have certain conditions or if you travel or work in places where you may be exposed to hepatitis B.  Haemophilus influenzae type b (Hib) vaccine. You may need this if you have certain risk factors.  Talk to your health care provider about which screenings and vaccines you need and how often you  need them. This information is not intended to replace advice given to you by your health care provider. Make sure you discuss any questions you have with your health care provider. Document Released: 01/25/2015 Document Revised: 09/18/2015 Document Reviewed: 10/30/2014 Elsevier Interactive Patient Education  [employer].

## 2017-09-26 ENCOUNTER — Other Ambulatory Visit: Payer: Self-pay | Admitting: Family Medicine

## 2017-11-04 DIAGNOSIS — R05 Cough: Secondary | ICD-10-CM | POA: Diagnosis not present

## 2017-11-04 DIAGNOSIS — J019 Acute sinusitis, unspecified: Secondary | ICD-10-CM | POA: Diagnosis not present

## 2017-12-04 ENCOUNTER — Other Ambulatory Visit: Payer: Self-pay | Admitting: Family Medicine

## 2017-12-30 ENCOUNTER — Other Ambulatory Visit: Payer: Self-pay | Admitting: Family Medicine

## 2018-01-22 NOTE — Progress Notes (Signed)
HPI:  Using dictation device. Unfortunately this device frequently misinterprets words/phrases.  Samuel Wiley is a pleasant 48 y.o. here for follow up. Chronic medical problems summarized below were reviewed for changes and stability and were updated as needed below. These issues and their treatment remain stable for the most part.  New concern of polyarthralgia. Reports this has been in hands for about 5 year. Now for 1-2 years in hands, feet, elbow, knees. Constant daily morning aching and stiffness, better once up and around. Intermittently the R 1st MTP joint is worse then others. No fevers, malaise, swelling, redness, warmth, skin changes. Taking 5000 IU vit d3 daily. No real regular exercise - but does work in garden. Diet not great. Mood good currently he reports though PHQ7. Declined treatment initially then agreed to consider seeing behavioral health. Denies CP, SOB, DOE, treatment intolerance or new symptoms. Due for flu shot, cardiology follow up, labs Schedule CPE - but he had his CPE in 07/2017 so is not yet due. Changed to follow up appointment.  Prediabetes/Obesity:  -advised lifestyle changes   Hyperlipidemia:  -resolved with lifestyle changes  Resistant HTN/LVH, OSA:  -meds:norvasc 10, nebivolol 5, terazosin - reports stopped the arb as thought caused a cough and lethargy -managed by his cardiologist, but did not follow up for annual follow up -s/p normal (per cardiology note) myocardial perfusion stress test for CP 12/2015 -saw pulm about CPAP, reports using every night -no reported: CP, sob, swelling, syncope   ED:  -followed by [facility]  -no reporteds: priapism, LUTS worsening  Hx anxiety and Depression -advised psychiatry care in 2016, he declined -reports doing well   ROS: See pertinent positives and negatives per HPI.  Past Medical History:  Diagnosis Date  . Anxiety and depression    looses weight with episodes, advised to see  psychiatrist 01/2014  . Asthma    childhood  . Chicken pox   . Diastolic dysfunction 68/12/7515   Grade 2 diastolic dysfunction on echo 08/2012.  Marland Kitchen GERD (gastroesophageal reflux disease)   . Heart palpitations   . History of echocardiogram    (5/09) showed EF 50-55%, mild LV dilation, mild LV hypertrophy, pseudonormal  diastolic function, mild mitral regurgitation, and mild left atrial enlargement.;  Echo 8/14:  Mild LVH, EF 55-60%, Gr 2 DD, mild MR, mild LAE  . Hypertension    high blood pressure readings   . Mild mitral regurgitation by prior echocardiogram   . Obesity   . Obstructive sleep apnea   . Perioperative myocardial infarction    Perioperative troponin elevation in 2007 after the patient's uvuloplasty.  He did have some chest pain at the time as well.  Left heart catheterization at that time showed normal coronary arteries and normal renal  arteries, EF was 50%.    Past Surgical History:  Procedure Laterality Date  . CARDIAC CATHETERIZATION     EF of 50% with Normal coronary arteries, normal renal arteries  . MASS EXCISION Left 10/09/2014   Procedure: EXCISION LEFT NECK LIPOMA;  Surgeon: Rozetta Nunnery, MD;  Location: [facility];  Service: ENT;  Laterality: Left;    Family History  Problem Relation Age of Onset  . Hypertension Father        family history of early onset  . Hypertension Mother   . Diabetes Mother     SOCIAL HX: see hpi   Current Outpatient Medications:  .  albuterol (PROAIR HFA) 108 (90 Base) MCG/ACT inhaler, Inhale 2 puffs  into the lungs every 6 (six) hours as needed for wheezing or shortness of breath., Disp: 1 Inhaler, Rfl: 0 .  amLODipine (NORVASC) 10 MG tablet, TAKE 1 TABLET BY MOUTH EVERY DAY, Disp: 30 tablet, Rfl: 0 .  arginine 500 MG tablet, Take 1,000 mg by mouth 3 (three) times daily. , Disp: , Rfl:  .  aspirin 81 MG tablet, Take 81 mg by mouth daily., Disp: , Rfl:  .  BYSTOLIC 5 MG tablet, TAKE 1 TABLET BY MOUTH EVERY  DAY, Disp: 90 tablet, Rfl: 0 .  Citrulline 600 MG CAPS, Take 1,000 mg by mouth 2 (two) times a week. , Disp: , Rfl:  .  Coenzyme Q10 (COQ10) 100 MG CAPS, Take 1 capsule by mouth daily., Disp: , Rfl:  .  Sildenafil Citrate (VIAGRA PO), Take by mouth as needed., Disp: , Rfl:  .  terazosin (HYTRIN) 10 MG capsule, Take 10 mg by mouth daily., Disp: , Rfl:   EXAM:  Vitals:   01/25/18 1053  BP: 110/72  Pulse: 62  Temp: 98.3 F (36.8 C)    Body mass index is 40.26 kg/m.  GENERAL: vitals reviewed and listed above, alert, oriented, appears well hydrated and in no acute distress  HEENT: atraumatic, conjunttiva clear, no obvious abnormalities on inspection of external nose and ears  NECK: no obvious masses on inspection  LUNGS: clear to auscultation bilaterally, no wheezes, rales or rhonchi, good air movement  CV: HRRR, no peripheral edema  MS: moves all extremities without noticeable abnormality, no sig swelling or deformity of hands or toes, elbows. Mild TTP around R MRP joint.  PSYCH: pleasant and cooperative, no obvious depression or anxiety  ASSESSMENT AND PLAN:  Discussed the following assessment and plan:  Polyarthralgia - Plan: Uric Acid, Rheumatoid Factor, Cyclic citrul peptide antibody, IgG -we discussed possible serious and likely etiologies, workup and treatment, treatment risks and return precautions -after this discussion, Vinod opted for labs per orders, xray, ? Rheum referral pending results. Suspect OA vs combo. Advised healthy diet, wt reduction, adequate vit D - will check today as taking large dose. -follow up advised 3 months -of course, we advised Verdie  to return or notify a doctor immediately if symptoms worsen or persist or new concerns arise.  Depression, recurrent ([place]) -advised tx, declined initially. may consider seeing Lanny Hurst for CBT, declined tx initially as he feels is doing well.  Essential hypertension - Plan: Basic metabolic panel, CBC -lifestyle  recs -cont current meds  Diastolic dysfunction -sees cardiology advised f.u. with cards  Obstructive sleep apnea syndrome -reports compliance with cpap -sees pulm  Morbid obesity ([place]) -lifestyle recs  Vitamin D deficiency - Plan: VITAMIN D 25 Hydroxy (Vit-D Deficiency, Fractures) - check level, adjust dose as needed  Great toe pain, right - Plan: DG Foot Complete Right, DG Foot Complete Right   -Patient advised to return or notify a doctor immediately if symptoms worsen or persist or new concerns arise.  Patient Instructions  BEFORE YOU LEAVE: -labs -xray -follow up: 3-4 months  Call to see Dennison Bulla if you can - I think you would really do well with his care.  Get the xray.  We have ordered labs or studies at this visit. It can take up to 1-2 weeks for results and processing. IF results require follow up or explanation, we will call you with instructions. Clinically stable results will be released to your [facility]. If you have not heard from [place] or cannot find your results in  MYCHART in 2 weeks please contact our office at [phone].  If you are not yet signed up for [facility], please consider signing up.    We recommend the following healthy lifestyle for LIFE: 1) Small portions. But, make sure to get regular (at least 3 per day), healthy meals and small healthy snacks if needed.  2) Eat a healthy clean diet.   TRY TO EAT: -at least 5-7 servings of low sugar, colorful, and nutrient rich vegetables per day (not corn, potatoes or bananas.) -berries are the best choice if you wish to eat fruit (only eat small amounts if trying to reduce weight)  -lean meets (fish, white meat of chicken or [place]) -vegan proteins for some meals - beans or tofu, whole grains, nuts and seeds -Replace bad fats with good fats - good fats include: fish, nuts and seeds, canola oil, olive oil -small amounts of low fat or non fat dairy -small amounts of100 % whole grains - check the  lables -drink plenty of water  AVOID: -SUGAR, sweets, anything with added sugar, corn syrup or sweeteners - must read labels as even foods advertised as "healthy" often are loaded with sugar -if you must have a sweetener, small amounts of stevia may be best -sweetened beverages and artificially sweetened beverages -simple starches (rice, bread, potatoes, pasta, chips, etc - small amounts of 100% whole grains are ok) -red meat, pork, butter -fried foods, fast food, processed food, excessive dairy, eggs and coconut.  3)Get at least 150 minutes of sweaty aerobic exercise per week.  4)Reduce stress - consider counseling, meditation and relaxation to balance other aspects of your life.         Lucretia Kern, DO

## 2018-01-24 ENCOUNTER — Other Ambulatory Visit: Payer: Self-pay | Admitting: Family Medicine

## 2018-01-25 ENCOUNTER — Ambulatory Visit (INDEPENDENT_AMBULATORY_CARE_PROVIDER_SITE_OTHER): Payer: 59

## 2018-01-25 ENCOUNTER — Encounter: Payer: Self-pay | Admitting: Family Medicine

## 2018-01-25 ENCOUNTER — Ambulatory Visit (INDEPENDENT_AMBULATORY_CARE_PROVIDER_SITE_OTHER): Payer: 59 | Admitting: Family Medicine

## 2018-01-25 VITALS — BP 110/72 | HR 62 | Temp 98.3°F | Ht 70.0 in | Wt 280.6 lb

## 2018-01-25 DIAGNOSIS — M79674 Pain in right toe(s): Secondary | ICD-10-CM | POA: Diagnosis not present

## 2018-01-25 DIAGNOSIS — F339 Major depressive disorder, recurrent, unspecified: Secondary | ICD-10-CM

## 2018-01-25 DIAGNOSIS — I5189 Other ill-defined heart diseases: Secondary | ICD-10-CM | POA: Diagnosis not present

## 2018-01-25 DIAGNOSIS — M2550 Pain in unspecified joint: Secondary | ICD-10-CM

## 2018-01-25 DIAGNOSIS — M25571 Pain in right ankle and joints of right foot: Secondary | ICD-10-CM | POA: Diagnosis not present

## 2018-01-25 DIAGNOSIS — G4733 Obstructive sleep apnea (adult) (pediatric): Secondary | ICD-10-CM

## 2018-01-25 DIAGNOSIS — I10 Essential (primary) hypertension: Secondary | ICD-10-CM

## 2018-01-25 DIAGNOSIS — E559 Vitamin D deficiency, unspecified: Secondary | ICD-10-CM | POA: Diagnosis not present

## 2018-01-25 LAB — BASIC METABOLIC PANEL
BUN: 18 mg/dL (ref 6–23)
CALCIUM: 9.7 mg/dL (ref 8.4–10.5)
CO2: 26 mEq/L (ref 19–32)
Chloride: 102 mEq/L (ref 96–112)
Creatinine, Ser: 1.13 mg/dL (ref 0.40–1.50)
GFR: 88.67 mL/min (ref >60.00)
GLUCOSE: 104 mg/dL — AB (ref 70–99)
Potassium: 4.3 mEq/L (ref 3.5–5.1)
SODIUM: 135 meq/L (ref 135–145)

## 2018-01-25 LAB — VITAMIN D 25 HYDROXY (VIT D DEFICIENCY, FRACTURES): VITD: 36.73 ng/mL (ref 30.00–100.00)

## 2018-01-25 LAB — CBC
HCT: 44.1 % (ref 39.0–52.0)
Hemoglobin: 14.5 g/dL (ref 13.0–17.0)
MCHC: 32.9 g/dL (ref 30.0–36.0)
MCV: 82.4 fl (ref 78.0–100.0)
Platelets: 190 10*3/uL (ref 150.0–400.0)
RBC: 5.35 Mil/uL (ref 4.22–5.81)
RDW: 14 % (ref 11.5–15.5)
WBC: 3.5 10*3/uL — ABNORMAL LOW (ref 4.0–10.5)

## 2018-01-25 LAB — URIC ACID: Uric Acid, Serum: 5.2 mg/dL (ref 4.0–7.8)

## 2018-01-25 NOTE — Patient Instructions (Signed)
BEFORE YOU LEAVE: -labs -xray -follow up: 3-4 months  Call to see Dennison Bulla if you can - I think you would really do well with his care.  Get the xray.  We have ordered labs or studies at this visit. It can take up to 1-2 weeks for results and processing. IF results require follow up or explanation, we will call you with instructions. Clinically stable results will be released to your [facility]. If you have not heard from [place] or cannot find your results in [facility] in 2 weeks please contact our office at [phone].  If you are not yet signed up for [facility], please consider signing up.    We recommend the following healthy lifestyle for LIFE: 1) Small portions. But, make sure to get regular (at least 3 per day), healthy meals and small healthy snacks if needed.  2) Eat a healthy clean diet.   TRY TO EAT: -at least 5-7 servings of low sugar, colorful, and nutrient rich vegetables per day (not corn, potatoes or bananas.) -berries are the best choice if you wish to eat fruit (only eat small amounts if trying to reduce weight)  -lean meets (fish, white meat of chicken or [place]) -vegan proteins for some meals - beans or tofu, whole grains, nuts and seeds -Replace bad fats with good fats - good fats include: fish, nuts and seeds, canola oil, olive oil -small amounts of low fat or non fat dairy -small amounts of100 % whole grains - check the lables -drink plenty of water  AVOID: -SUGAR, sweets, anything with added sugar, corn syrup or sweeteners - must read labels as even foods advertised as "healthy" often are loaded with sugar -if you must have a sweetener, small amounts of stevia may be best -sweetened beverages and artificially sweetened beverages -simple starches (rice, bread, potatoes, pasta, chips, etc - small amounts of 100% whole grains are ok) -red meat, pork, butter -fried foods, fast food, processed food, excessive dairy, eggs and coconut.  3)Get at least 150 minutes of  sweaty aerobic exercise per week.  4)Reduce stress - consider counseling, meditation and relaxation to balance other aspects of your life.

## 2018-01-26 LAB — RHEUMATOID FACTOR: Rheumatoid fact SerPl-aCnc: <14 IU/mL (ref <14)

## 2018-01-26 LAB — CYCLIC CITRUL PEPTIDE ANTIBODY, IGG: CYCLIC CITRULLIN PEPTIDE AB: 16 U

## 2018-02-07 ENCOUNTER — Other Ambulatory Visit: Payer: Self-pay | Admitting: Family Medicine

## 2018-03-21 ENCOUNTER — Ambulatory Visit (INDEPENDENT_AMBULATORY_CARE_PROVIDER_SITE_OTHER): Payer: 59

## 2018-03-21 ENCOUNTER — Ambulatory Visit: Payer: Self-pay | Admitting: *Deleted

## 2018-03-21 ENCOUNTER — Ambulatory Visit: Payer: 59 | Admitting: Family Medicine

## 2018-03-21 ENCOUNTER — Encounter: Payer: Self-pay | Admitting: Family Medicine

## 2018-03-21 VITALS — BP 124/70 | HR 60 | Temp 98.7°F | Resp 12 | Ht 70.0 in | Wt 276.4 lb

## 2018-03-21 DIAGNOSIS — R0782 Intercostal pain: Secondary | ICD-10-CM

## 2018-03-21 DIAGNOSIS — R202 Paresthesia of skin: Secondary | ICD-10-CM

## 2018-03-21 DIAGNOSIS — R0781 Pleurodynia: Secondary | ICD-10-CM | POA: Diagnosis not present

## 2018-03-21 DIAGNOSIS — R0789 Other chest pain: Secondary | ICD-10-CM | POA: Diagnosis not present

## 2018-03-21 MED ORDER — MELOXICAM 7.5 MG PO TABS: 7.5000 mg | ORAL_TABLET | Freq: Every day | ORAL | 0 refills | Status: AC

## 2018-03-21 NOTE — Progress Notes (Signed)
ACUTE VISIT   HPI:  Chief Complaint  Patient presents with  . Left arm pain/tingling    pain under left arm, has had pain before, but not like it is today, tingling feeling has been in arm most of the day    Samuel Wiley is a 48 y.o. male with history of hypertension, OSA, and palpitations (PACs) is here today complaining of left lateral chest pain that is started this morning. Achy/sharp pain,mild, no radiated, and intermittent.  He has had pain around same area before but he describes it as different type of pain. No history of recent trauma or unusual physical activity. RUE intermittent tingling sensation,mainly left hand. Denies prior Hx. Denies neck pain or focal weakness.  He is concerned because 2 days ago he stayed with family and they "smoked all day", concerned about illness related to smoke exposure.  He has not noted a rash or skin changes. Problem does not interfere with daily activities.  Pain exacerbated by palpation around the area. He has not identified alleviating factors. Pain is overall stable.   He is concerned about this being heart related. He denies associated dyspnea, diaphoresis, or palpitations. Denies orthopnea or PND.  With deep breathing he has some upper back pain.  He recently had work-up done due to polyarthralgia, 01/25/18.   Review of Systems  Constitutional: Negative for activity change, appetite change, fatigue and fever.  HENT: Negative for sore throat and trouble swallowing.   Respiratory: Negative for shortness of breath and wheezing.   Cardiovascular: Negative for palpitations and leg swelling.  Musculoskeletal: Positive for back pain. Negative for gait problem and neck pain.  Skin: Negative for color change and rash.  Neurological: Negative for weakness and headaches.  Psychiatric/Behavioral: Negative for sleep disturbance. The patient is nervous/anxious.    Current Outpatient Medications on File Prior to Visit    Medication Sig Dispense Refill  . albuterol (PROAIR HFA) 108 (90 Base) MCG/ACT inhaler Inhale 2 puffs into the lungs every 6 (six) hours as needed for wheezing or shortness of breath. 1 Inhaler 0  . amLODipine (NORVASC) 10 MG tablet TAKE 1 TABLET BY MOUTH EVERY DAY 30 tablet 5  . arginine 500 MG tablet Take 1,000 mg by mouth 3 (three) times daily.     Marland Kitchen aspirin 81 MG tablet Take 81 mg by mouth daily.    Marland Kitchen BYSTOLIC 5 MG tablet TAKE 1 TABLET BY MOUTH EVERY DAY 90 tablet 1  . Citrulline 600 MG CAPS Take 1,000 mg by mouth 2 (two) times a week.     Marland Kitchen oxybutynin (DITROPAN) 5 MG tablet TAKE 1 TABLET BY MOUTH EVERY 8 HOURS AS NEEDED FOR BLADDER SPASMS    . Sildenafil Citrate (VIAGRA PO) Take by mouth as needed.    . terazosin (HYTRIN) 10 MG capsule Take 10 mg by mouth daily.     No current facility-administered medications on file prior to visit.      Past Medical History:  Diagnosis Date  . Anxiety and depression    looses weight with episodes, advised to see psychiatrist 01/2014  . Asthma    childhood  . Chicken pox   . Diastolic dysfunction 13/24/4010   Grade 2 diastolic dysfunction on echo 08/2012.  Marland Kitchen GERD (gastroesophageal reflux disease)   . Heart palpitations   . History of echocardiogram    (5/09) showed EF 50-55%, mild LV dilation, mild LV hypertrophy, pseudonormal  diastolic function, mild mitral regurgitation, and mild left atrial  enlargement.;  Echo 8/14:  Mild LVH, EF 55-60%, Gr 2 DD, mild MR, mild LAE  . Hypertension    high blood pressure readings   . Mild mitral regurgitation by prior echocardiogram   . Obesity   . Obstructive sleep apnea   . Perioperative myocardial infarction    Perioperative troponin elevation in 2007 after the patient's uvuloplasty.  He did have some chest pain at the time as well.  Left heart catheterization at that time showed normal coronary arteries and normal renal  arteries, EF was 50%.   Allergies  Allergen Reactions  . Milk Protein   .  Clavulanic Acid Diarrhea    Social History   Socioeconomic History  . Marital status: Single    Spouse name: Not on file  . Number of children: Not on file  . Years of education: Not on file  . Highest education level: Not on file  Occupational History  . Not on file  Social Needs  . Financial resource strain: Not on file  . Food insecurity:    Worry: Not on file    Inability: Not on file  . Transportation needs:    Medical: Not on file    Non-medical: Not on file  Tobacco Use  . Smoking status: Never Smoker  . Smokeless tobacco: Never Used  Substance and Sexual Activity  . Alcohol use: Yes    Comment: few drinks occ  . Drug use: Not on file  . Sexual activity: Not on file  Lifestyle  . Physical activity:    Days per week: Not on file    Minutes per session: Not on file  . Stress: Not on file  Relationships  . Social connections:    Talks on phone: Not on file    Gets together: Not on file    Attends religious service: Not on file    Active member of club or organization: Not on file    Attends meetings of clubs or organizations: Not on file    Relationship status: Not on file  Other Topics Concern  . Not on file  Social History Narrative   Work or School: Radio broadcast assistant Situation: lives alone      Spiritual Beliefs: none      Lifestyle: intermittent weight lifting and gym; diet is bad             Vitals:   03/21/18 1627  BP: 124/70  Pulse: 60  Resp: 12  Temp: 98.7 F (37.1 C)  SpO2: 96%   Body mass index is 39.66 kg/m.  Physical Exam  Nursing note and vitals reviewed. Constitutional: He is oriented to person, place, and time. He appears well-developed. No distress.  HENT:  Head: Normocephalic and atraumatic.  Mouth/Throat: Oropharynx is clear and moist and mucous membranes are normal.  Eyes: Conjunctivae are normal.  Cardiovascular: Normal rate and regular rhythm.  No murmur heard. Pulses:      Dorsalis pedis pulses are  2+ on the right side and 2+ on the left side.    Respiratory: Effort normal and breath sounds normal. No respiratory distress. He exhibits no tenderness.  Musculoskeletal:        General: No edema.     Thoracic back: He exhibits no tenderness and no bony tenderness.     Comments: Pain upon palpation on anterior axillary line,diagonal to nipple left side. See graphic CV.  Lymphadenopathy:    He has no cervical adenopathy.  Neurological:  He is alert and oriented to person, place, and time. He has normal strength. No cranial nerve deficit. Gait normal.  Skin: Skin is warm. No rash noted. No erythema.  Psychiatric: His mood appears anxious.  Well groomed, good eye contact.   ASSESSMENT AND PLAN:   Samuel Wiley was seen today for left arm pain/tingling.  Diagnoses and all orders for this visit:  Intercostal pain Reporting this as a new problem. Explained that history and findings on examination do not suggest cardiac etiology, still probability is never 0, so instructed about warning signs. Stress test done in 12/2015 was negative for ischemia. Echocardiogram in 08/22/2012 showed grade 2 diastolic dysfunction. I think pain is musculoskeletal, so recommend Topical OTC IcyHot or Aspercreme with lidocaine and local ice. After discussion of some side effects, he agrees with trying Mobic 7.5 mg 1 to 2 tablets daily for 7 to 10 days. Repeat imaging ordered today, further recommendation will be given according to results.  F/U with PCP as needed.  -     DG Ribs Unilateral W/Chest Left; Future -     meloxicam (MOBIC) 7.5 MG tablet; Take 1-2 tablets (7.5-15 mg total) by mouth daily for 10 days.  Tingling of right upper extremity We discussed possible etiologies,?  Carpal tunnel syndrome, radicular symptom. I do not think further blood work-up is needed at this time, recommend continue following. Instructed about warning signs.    Return if symptoms worsen or fail to improve.    Betty  G. Martinique, MD  [facility]. [place] office.

## 2018-03-21 NOTE — Telephone Encounter (Signed)
Pt called with complaints of pain on the underside of his left arm; this also involves his hand; this started 03/21/2018 at 0730; he says that the pain is intermittent; he also says that his left shoulder was feeling stiff; he reports numbess and tingling; he says that the pain is not worse than his normal; the pt reports that the numbness has stopped but his had still feels "tingly" and his shoulder is not as stiff; nurse triage initiated and recommendation made per protocol; the pt is normally seen by Dr Maudie Mercury, and would like to be seen in the office today; she has no availability; pt offered and accepted appointment with Dr Betty Martinique, [place], 03/21/2018 at 1645; he verbalized understanding; also spoke with Sheena in regards to scheduling pt; will route to office for notification.      Reason for Disposition . Numbness (i.e., loss of sensation) in hand or fingers  Answer Assessment - Initial Assessment Questions 1. ONSET: "When did the pain start?"     03/21/2018 2. LOCATION: "Where is the pain located?"     Underside of left arm 3. PAIN: "How bad is the pain?" (Scale 1-10; or mild, moderate, severe)   - MILD (1-3): doesn't interfere with normal activities   - MODERATE (4-7): interferes with normal activities (e.g., work or school) or awakens from sleep   - SEVERE (8-10): excruciating pain, unable to do any normal activities, unable to hold a cup of water     Moderate  4. WORK OR EXERCISE: "Has there been any recent work or exercise that involved this part of the body?"     no 5. CAUSE: "What do you think is causing the arm pain?"     Not sure 6. OTHER SYMPTOMS: "Do you have any other symptoms?" (e.g., neck pain, swelling, rash, fever, numbness, weakness)     Numbness and tingling; tired 7. PREGNANCY: "Is there any chance you are pregnant?" "When was your last menstrual period?"     n/a  Protocols used: ARM PAIN-A-AH

## 2018-03-21 NOTE — Patient Instructions (Signed)
  [name] I have seen you today for an acute visit.  A few things to remember from today's visit:   Intercostal pain - Plan: DG Ribs Unilateral W/Chest Left, meloxicam (MOBIC) 7.5 MG tablet Findings today do not suggest a serious problem. Monitor for new symptoms, including rash.  Pain seems to be caused by muscles around the ribs.   In general please monitor for signs of worsening symptoms and seek immediate medical attention if any concerning.  If symptoms are not resolved in 2-3 weeks you should schedule a follow up appointment with your doctor, before if needed.  I hope you get better soon!

## 2018-03-22 ENCOUNTER — Encounter: Payer: Self-pay | Admitting: Family Medicine

## 2018-07-22 ENCOUNTER — Other Ambulatory Visit: Payer: Self-pay | Admitting: Family Medicine

## 2018-09-09 ENCOUNTER — Other Ambulatory Visit: Payer: Self-pay | Admitting: Family Medicine

## 2018-09-23 ENCOUNTER — Other Ambulatory Visit: Payer: Self-pay | Admitting: Family Medicine

## 2018-09-29 ENCOUNTER — Other Ambulatory Visit: Payer: Self-pay

## 2018-09-29 ENCOUNTER — Encounter: Payer: Self-pay | Admitting: Family Medicine

## 2018-09-29 ENCOUNTER — Telehealth (INDEPENDENT_AMBULATORY_CARE_PROVIDER_SITE_OTHER): Payer: 59 | Admitting: Family Medicine

## 2018-09-29 DIAGNOSIS — I10 Essential (primary) hypertension: Secondary | ICD-10-CM

## 2018-09-29 DIAGNOSIS — R739 Hyperglycemia, unspecified: Secondary | ICD-10-CM | POA: Diagnosis not present

## 2018-09-29 DIAGNOSIS — E785 Hyperlipidemia, unspecified: Secondary | ICD-10-CM | POA: Diagnosis not present

## 2018-09-29 MED ORDER — NEBIVOLOL HCL 5 MG PO TABS: 5.0000 mg | ORAL_TABLET | Freq: Every day | ORAL | 1 refills | Status: DC

## 2018-09-29 MED ORDER — AMLODIPINE BESYLATE 10 MG PO TABS: 10.0000 mg | ORAL_TABLET | Freq: Every day | ORAL | 1 refills | Status: DC

## 2018-09-29 NOTE — Progress Notes (Signed)
Virtual Visit via Video Note  I connected with [name]  on 09/29/18 at 10:15 AM EDT by a video enabled telemedicine application and verified that I am speaking with the correct person using two identifiers.  Location patient: work Environmental [employer] or home office Persons participating in the virtual visit: patient, provider    HPI:   Samuel Wiley is a pleasant 48 y.o. seen for follow up. Chronic medical problems summarized below were reviewed for changes and stability and were updated as needed below. These issues and their treatment remain stable for the most part. Reports has BP cuff at home and agrees to check BP and email [place] the results. Reports is doing well. Working. Using masking and precautions in the [place] pandemic. Reports is eating health and getting regular exercise. Request refills on amlodipine and bystolic. Saw my colleague back in march for rib pain, reports completely resolved. No reported CP, SOB, DOE, treatment intolerance or new symptoms.  Prediabetes/Obesity:  -advised lifestyle changes   Hyperlipidemia:  -resolved with lifestyle changes  Resistant HTN/LVH, OSA:  -meds:norvasc 10, nebivolol 5, terazosin - reports stopped the arb as thought caused a cough and lethargy -managed by his cardiologist, but did not follow up for annual follow up -s/p normal (per cardiology note) myocardial perfusion stress test for CP 12/2015 -saw pulm about CPAP, reports using every night -no reported: CP, sob, swelling, syncope   ED:  -followed by [facility]  -no reporteds: priapism, LUTS worsening  Hx of anxiety and Depression -advised psychiatry care in 2016, he declined -reports doing well today  ROS: See pertinent positives and negatives per HPI.  Past Medical History:  Diagnosis Date  . Anxiety and depression    looses weight with episodes, advised to see psychiatrist 01/2014  . Asthma    childhood  . Chicken pox   . Diastolic dysfunction  [id number]   Grade 2 diastolic dysfunction on echo 08/2012.  Marland Kitchen GERD (gastroesophageal reflux disease)   . Heart palpitations   . History of echocardiogram    (5/09) showed EF 50-55%, mild LV dilation, mild LV hypertrophy, pseudonormal  diastolic function, mild mitral regurgitation, and mild left atrial enlargement.;  Echo 8/14:  Mild LVH, EF 55-60%, Gr 2 DD, mild MR, mild LAE  . Hypertension    high blood pressure readings   . Mild mitral regurgitation by prior echocardiogram   . Obesity   . Obstructive sleep apnea   . Perioperative myocardial infarction    Perioperative troponin elevation in 2007 after the patient's uvuloplasty.  He did have some chest pain at the time as well.  Left heart catheterization at that time showed normal coronary arteries and normal renal  arteries, EF was 50%.    Past Surgical History:  Procedure Laterality Date  . CARDIAC CATHETERIZATION     EF of 50% with Normal coronary arteries, normal renal arteries  . MASS EXCISION Left 10/09/2014   Procedure: EXCISION LEFT NECK LIPOMA;  Surgeon: Rozetta Nunnery, MD;  Location: [place];  Service: ENT;  Laterality: Left;    Family History  Problem Relation Age of Onset  . Hypertension Father        family history of early onset  . Hypertension Mother   . Diabetes Mother     SOCIAL HX: see hpi   Current Outpatient Medications:  .  albuterol (PROAIR HFA) 108 (90 Base) MCG/ACT inhaler, Inhale 2 puffs into the lungs every 6 (six) hours as needed for wheezing or  shortness of breath., Disp: 1 Inhaler, Rfl: 0 .  amLODipine (NORVASC) 10 MG tablet, Take 1 tablet (10 mg total) by mouth daily., Disp: 90 tablet, Rfl: 1 .  arginine 500 MG tablet, Take 1,000 mg by mouth 3 (three) times daily. , Disp: , Rfl:  .  aspirin 81 MG tablet, Take 81 mg by mouth daily., Disp: , Rfl:  .  nebivolol (BYSTOLIC) 5 MG tablet, Take 1 tablet (5 mg total) by mouth daily., Disp: 90 tablet, Rfl: 1 .  oxybutynin (DITROPAN)  5 MG tablet, TAKE 1 TABLET BY MOUTH EVERY 8 HOURS AS NEEDED FOR BLADDER SPASMS, Disp: , Rfl:  .  Sildenafil Citrate (VIAGRA PO), Take by mouth as needed., Disp: , Rfl:  .  terazosin (HYTRIN) 10 MG capsule, Take 10 mg by mouth daily., Disp: , Rfl:   EXAM:  VITALS per patient if applicable:  GENERAL: alert, oriented, appears well and in no acute distress  HEENT: atraumatic, conjunttiva clear, no obvious abnormalities on inspection of external nose and ears  NECK: normal movements of the head and neck  LUNGS: on inspection no signs of respiratory distress, breathing rate appears normal, no obvious gross SOB, gasping or wheezing  CV: no obvious cyanosis  MS: moves all visible extremities without noticeable abnormality  PSYCH/NEURO: pleasant and cooperative, no obvious depression or anxiety, speech and thought processing grossly intact  ASSESSMENT AND PLAN:  Discussed the following assessment and plan:  Essential hypertension - Plan: Basic metabolic panel, CBC  Hyperglycemia - Plan: Hemoglobin A1c  Hyperlipidemia, unspecified hyperlipidemia type - Plan: Lipid panel  -we discussed possible serious and likely etiologies, options for evaluation and workup, limitations of telemedicine visit vs in person visit, treatment, treatment risks and precautions. Pt prefers to treat via telemedicine empirically rather then risking or undertaking an in person visit at this moment. Patient agrees to seek prompt in person care if worsening, new symptoms arise, or if is not improving with treatment.   I discussed the assessment and treatment plan with the patient. The patient was provided an opportunity to ask questions and all were answered. The patient agreed with the plan and demonstrated an understanding of the instructions.   The patient was advised to call back or seek an in-person evaluation if the symptoms worsen or if the condition fails to improve as anticipated.   Lucretia Kern, DO    Patient Instructions  Ronnald,  Please check your blood pressure at home and send [place] a mychart message with the results.  I sent in the refills you requested, please let us know if you have any issues getting your medications.  I sent a message to my scheduling specialist to assist you with a lab visit and with scheduling and in person visit with Dr. Ethlyn Gallery to meet her. You are welcome to follow up with me any time that you prefer a virtual visit.   Continue a healthy diet and regular exercise.

## 2018-09-29 NOTE — Patient Instructions (Signed)
Samuel Wiley,  Please check your blood pressure at home and send [place] a mychart message with the results.  I sent in the refills you requested, please let us know if you have any issues getting your medications.  I sent a message to my scheduling specialist to assist you with a lab visit and with scheduling and in person visit with Dr. Ethlyn Gallery to meet her. You are welcome to follow up with me any time that you prefer a virtual visit.   Continue a healthy diet and regular exercise.

## 2019-02-14 ENCOUNTER — Other Ambulatory Visit: Payer: Self-pay | Admitting: *Deleted

## 2019-02-14 MED ORDER — AMLODIPINE BESYLATE 10 MG PO TABS: 10.0000 mg | ORAL_TABLET | Freq: Every day | ORAL | 0 refills | Status: DC

## 2019-02-14 MED ORDER — NEBIVOLOL HCL 5 MG PO TABS: 5.0000 mg | ORAL_TABLET | Freq: Every day | ORAL | 0 refills | Status: DC

## 2019-02-14 NOTE — Telephone Encounter (Signed)
[facility] 14 day supply as the pt is in [place] and forgot his blood pressure medications.  Rx done.

## 2019-04-17 ENCOUNTER — Other Ambulatory Visit: Payer: Self-pay | Admitting: Family Medicine

## 2019-05-19 ENCOUNTER — Other Ambulatory Visit: Payer: Self-pay | Admitting: Family Medicine

## 2019-06-01 ENCOUNTER — Other Ambulatory Visit: Payer: Self-pay | Admitting: Family Medicine

## 2019-08-11 ENCOUNTER — Other Ambulatory Visit: Payer: Self-pay | Admitting: Family Medicine

## 2019-10-29 ENCOUNTER — Other Ambulatory Visit: Payer: Self-pay | Admitting: Family Medicine

## 2019-11-01 ENCOUNTER — Encounter: Payer: 59 | Admitting: Internal Medicine

## 2019-11-06 ENCOUNTER — Telehealth: Payer: Self-pay | Admitting: Family Medicine

## 2019-11-06 NOTE — Telephone Encounter (Signed)
Ok with me 

## 2019-11-06 NOTE — Telephone Encounter (Signed)
Pt would like to transfer care from dr kim to dr cody.  He lives closer to [place] to schedule??

## 2019-11-12 ENCOUNTER — Other Ambulatory Visit: Payer: Self-pay | Admitting: Family Medicine

## 2019-11-14 ENCOUNTER — Other Ambulatory Visit: Payer: Self-pay | Admitting: Family Medicine

## 2019-11-20 NOTE — Telephone Encounter (Signed)
Called patient to schedule appointment. Call could not connect.

## 2019-12-09 ENCOUNTER — Other Ambulatory Visit: Payer: Self-pay | Admitting: Family Medicine

## 2019-12-12 ENCOUNTER — Other Ambulatory Visit: Payer: Self-pay | Admitting: Family Medicine

## 2020-01-03 ENCOUNTER — Encounter: Payer: 59 | Admitting: Internal Medicine

## 2020-01-08 ENCOUNTER — Other Ambulatory Visit: Payer: Self-pay

## 2020-01-08 ENCOUNTER — Ambulatory Visit: Payer: 59 | Admitting: Family Medicine

## 2020-01-08 ENCOUNTER — Encounter: Payer: Self-pay | Admitting: Family Medicine

## 2020-01-08 VITALS — BP 140/80 | HR 55 | Temp 97.1°F | Ht 69.25 in | Wt 272.0 lb

## 2020-01-08 DIAGNOSIS — M79671 Pain in right foot: Secondary | ICD-10-CM | POA: Diagnosis not present

## 2020-01-08 DIAGNOSIS — N400 Enlarged prostate without lower urinary tract symptoms: Secondary | ICD-10-CM | POA: Insufficient documentation

## 2020-01-08 DIAGNOSIS — N401 Enlarged prostate with lower urinary tract symptoms: Secondary | ICD-10-CM

## 2020-01-08 DIAGNOSIS — I10 Essential (primary) hypertension: Secondary | ICD-10-CM | POA: Diagnosis not present

## 2020-01-08 DIAGNOSIS — Z13220 Encounter for screening for lipoid disorders: Secondary | ICD-10-CM | POA: Diagnosis not present

## 2020-01-08 DIAGNOSIS — Z23 Encounter for immunization: Secondary | ICD-10-CM | POA: Diagnosis not present

## 2020-01-08 DIAGNOSIS — N529 Male erectile dysfunction, unspecified: Secondary | ICD-10-CM

## 2020-01-08 DIAGNOSIS — J4520 Mild intermittent asthma, uncomplicated: Secondary | ICD-10-CM

## 2020-01-08 DIAGNOSIS — R351 Nocturia: Secondary | ICD-10-CM

## 2020-01-08 DIAGNOSIS — J45909 Unspecified asthma, uncomplicated: Secondary | ICD-10-CM | POA: Insufficient documentation

## 2020-01-08 DIAGNOSIS — I491 Atrial premature depolarization: Secondary | ICD-10-CM

## 2020-01-08 DIAGNOSIS — G4733 Obstructive sleep apnea (adult) (pediatric): Secondary | ICD-10-CM

## 2020-01-08 DIAGNOSIS — R7303 Prediabetes: Secondary | ICD-10-CM | POA: Diagnosis not present

## 2020-01-08 DIAGNOSIS — Z1159 Encounter for screening for other viral diseases: Secondary | ICD-10-CM

## 2020-01-08 LAB — LIPID PANEL
Cholesterol: 175 mg/dL (ref 0–200)
HDL: 45.3 mg/dL (ref >39.00)
LDL Cholesterol: 120 mg/dL — ABNORMAL HIGH (ref 0–99)
NonHDL: 130.17
Total CHOL/HDL Ratio: 4
Triglycerides: 53 mg/dL (ref 0.0–149.0)
VLDL: 10.6 mg/dL (ref 0.0–40.0)

## 2020-01-08 LAB — COMPREHENSIVE METABOLIC PANEL
ALT: 30 U/L (ref 0–53)
AST: 22 U/L (ref 0–37)
Albumin: 4.4 g/dL (ref 3.5–5.2)
Alkaline Phosphatase: 53 U/L (ref 39–117)
BUN: 18 mg/dL (ref 6–23)
CO2: 28 mEq/L (ref 19–32)
Calcium: 9.8 mg/dL (ref 8.4–10.5)
Chloride: 101 mEq/L (ref 96–112)
Creatinine, Ser: 1.17 mg/dL (ref 0.40–1.50)
GFR: 72.42 mL/min (ref >60.00)
Glucose, Bld: 100 mg/dL — ABNORMAL HIGH (ref 70–99)
Potassium: 4.1 mEq/L (ref 3.5–5.1)
Sodium: 135 mEq/L (ref 135–145)
Total Bilirubin: 0.6 mg/dL (ref 0.2–1.2)
Total Protein: 7.8 g/dL (ref 6.0–8.3)

## 2020-01-08 LAB — HEMOGLOBIN A1C: Hgb A1c MFr Bld: 6.2 % (ref 4.6–6.5)

## 2020-01-08 NOTE — Assessment & Plan Note (Signed)
Tenderness over the achilles tendon but not severely limiting. Eccentric exercises advised.

## 2020-01-08 NOTE — Assessment & Plan Note (Signed)
Stable on bystolic 5 mg.

## 2020-01-08 NOTE — Patient Instructions (Addendum)
Your screen for diabetes shows that you have prediabetes. This means that you are at risk for developing diabetes.   You can slow the progression to diabetes through healthy diet and exercise.   Eat healthy foods Get at least 150 minutes of moderate aerobic physical activity a week, or about 30 minutes on most days of the week Lose excess weight Control your blood pressure and cholesterol Don't smoke  Plan on follow-up based on lab results  For your Achilles Tendon  Eccentric heel drop  In this exercise, you stand and slowly raise your heel and then slowly lower it. This exercise lengthens the calf muscles (eccentric) while the heel bears weight. If this exercise is too easy, try doing it while wearing a backpack with weights in it. 1. Stand on a step with the balls of your feet. The ball of your foot is on the walking surface, right under your toes. ? Do not put your heels on the step. ? For balance, rest your hands on the wall or on a railing. 2. Rise up onto the balls of your feet. 3. Keeping your heels up, shift all of your weight to your left / right leg and pick up your other leg. 4. Slowly lower your left / right leg so your heel drops below the level of the step. 5. Put down your other foot before returning to the start position. If told by your health care provider, build up to: ? 3 sets of 15 repetitions while keeping your knees straight. ? 3 sets of 15 repetitions while keeping your knees slightly bent as far as told by your health care provider.

## 2020-01-08 NOTE — Assessment & Plan Note (Signed)
Reports good control at home. Cont bystolic 5 mg, amlodipine 10 mg, and terazosin 10 mg BID

## 2020-01-08 NOTE — Assessment & Plan Note (Signed)
Stable. Cont cpap

## 2020-01-08 NOTE — Assessment & Plan Note (Signed)
Discussed healthy diet and exercise. He is willing to start metformin if needed.

## 2020-01-08 NOTE — Assessment & Plan Note (Signed)
Rare use of albuterol. Cont prn

## 2020-01-08 NOTE — Assessment & Plan Note (Signed)
Following with urology, appreciate support. Notes stable symptoms. Cont oxybutynin and terazosin.

## 2020-01-08 NOTE — Progress Notes (Signed)
Subjective:     Samuel Wiley is a 49 y.o. male presenting for Establish Care (Concerned with weight ) and Foot Problem (R achilles "lump")     HPI   #Right foot pain - started a few months ago - has a lump  #HTN - home monitoring 125/70 - taking amlodipine 10 mg - also on terazosin for BPH and some bp support - taking bystolic - started taking Asprin 20 years ago   #enlarged prostate - symptoms are stable - taking terazosin 10 mg bid - also on oxybutynin 5 mg - nocturia changes with diet/hydration   Review of Systems   Social History   Tobacco Use  Smoking Status Never Smoker  Smokeless Tobacco Never Used        Objective:    BP Readings from Last 3 Encounters:  01/08/20 140/80  03/21/18 124/70  01/25/18 110/72   Wt Readings from Last 3 Encounters:  01/08/20 272 lb (123.4 kg)  03/21/18 276 lb 6 oz (125.4 kg)  01/25/18 280 lb 9.6 oz (127.3 kg)    BP 140/80   Pulse (!) 55   Temp (!) 97.1 F (36.2 C) (Temporal)   Ht 5' 9.25" (1.759 m)   Wt 272 lb (123.4 kg)   SpO2 98%   BMI 39.88 kg/m    Physical Exam Constitutional:      Appearance: Normal appearance. He is not ill-appearing or diaphoretic.  HENT:     Right Ear: External ear normal.     Left Ear: External ear normal.     Nose: Nose normal.  Eyes:     General: No scleral icterus.    Extraocular Movements: Extraocular movements intact.     Conjunctiva/sclera: Conjunctivae normal.  Cardiovascular:     Rate and Rhythm: Normal rate and regular rhythm.     Heart sounds: No murmur heard.   Pulmonary:     Effort: Pulmonary effort is normal. No respiratory distress.     Breath sounds: Normal breath sounds. No wheezing.  Musculoskeletal:     Cervical back: Neck supple.     Comments: Tenderness over the right achilles tendon  Skin:    General: Skin is warm and dry.  Neurological:     Mental Status: He is alert. Mental status is at baseline.  Psychiatric:        Mood and Affect: Mood  normal.         Assessment & Plan:   Problem List Items Addressed This Visit      Cardiovascular and Mediastinum   Essential hypertension - Primary    Reports good control at home. Cont bystolic 5 mg, amlodipine 10 mg, and terazosin 10 mg BID      Relevant Orders   Comprehensive metabolic panel   PREMATURE ATRIAL CONTRACTIONS    Stable on bystolic 5 mg.         Respiratory   Sleep apnea    Stable. Cont cpap      Asthma    Rare use of albuterol. Cont prn        Genitourinary   BPH (benign prostatic hyperplasia)    Following with urology, appreciate support. Notes stable symptoms. Cont oxybutynin and terazosin.         Other   Erectile dysfunction   Prediabetes    Discussed healthy diet and exercise. He is willing to start metformin if needed.       Relevant Orders   Hemoglobin A1c   Right foot pain  Tenderness over the achilles tendon but not severely limiting. Eccentric exercises advised.        Other Visit Diagnoses    Need for influenza vaccination       Relevant Orders   Flu Vaccine QUAD 36+ mos IM (Completed)   Screening for hyperlipidemia       Relevant Orders   Lipid panel   Need for hepatitis C screening test       Relevant Orders   Hepatitis C antibody       Return if symptoms worsen or fail to improve, for based on lab results.  Lesleigh Noe, MD  This visit occurred during the SARS-CoV-2 public health emergency.  Safety protocols were in place, including screening questions prior to the visit, additional usage of staff PPE, and extensive cleaning of exam room while observing appropriate contact time as indicated for disinfecting solutions.

## 2020-01-09 ENCOUNTER — Encounter: Payer: Self-pay | Admitting: Family Medicine

## 2020-01-09 DIAGNOSIS — E782 Mixed hyperlipidemia: Secondary | ICD-10-CM

## 2020-01-09 LAB — HEPATITIS C ANTIBODY
Hepatitis C Ab: NONREACTIVE
SIGNAL TO CUT-OFF: 0.01 (ref <1.00)

## 2020-01-10 MED ORDER — ATORVASTATIN CALCIUM 10 MG PO TABS: 10.0000 mg | ORAL_TABLET | Freq: Every day | ORAL | 3 refills | Status: DC

## 2020-02-03 ENCOUNTER — Other Ambulatory Visit: Payer: Self-pay | Admitting: Family Medicine

## 2020-02-07 ENCOUNTER — Other Ambulatory Visit: Payer: Self-pay | Admitting: Family Medicine

## 2020-02-15 ENCOUNTER — Encounter: Payer: Self-pay | Admitting: Family Medicine

## 2020-02-16 ENCOUNTER — Other Ambulatory Visit: Payer: Self-pay

## 2020-02-16 MED ORDER — TERAZOSIN HCL 10 MG PO CAPS: 10.0000 mg | ORAL_CAPSULE | Freq: Two times a day (BID) | ORAL | 0 refills | Status: DC

## 2020-04-24 ENCOUNTER — Other Ambulatory Visit: Payer: Self-pay | Admitting: Family Medicine

## 2020-10-19 IMAGING — DX LEFT RIBS AND CHEST - 3+ VIEW
4 series · 4 of 4 positions shown · non-contrast
Comparison: PA and lateral chest 10/12/2011.

CLINICAL DATA: Left chest and rib pain.  No known injury.

EXAM:
LEFT RIBS AND CHEST - 3+ VIEW

[chest pa]
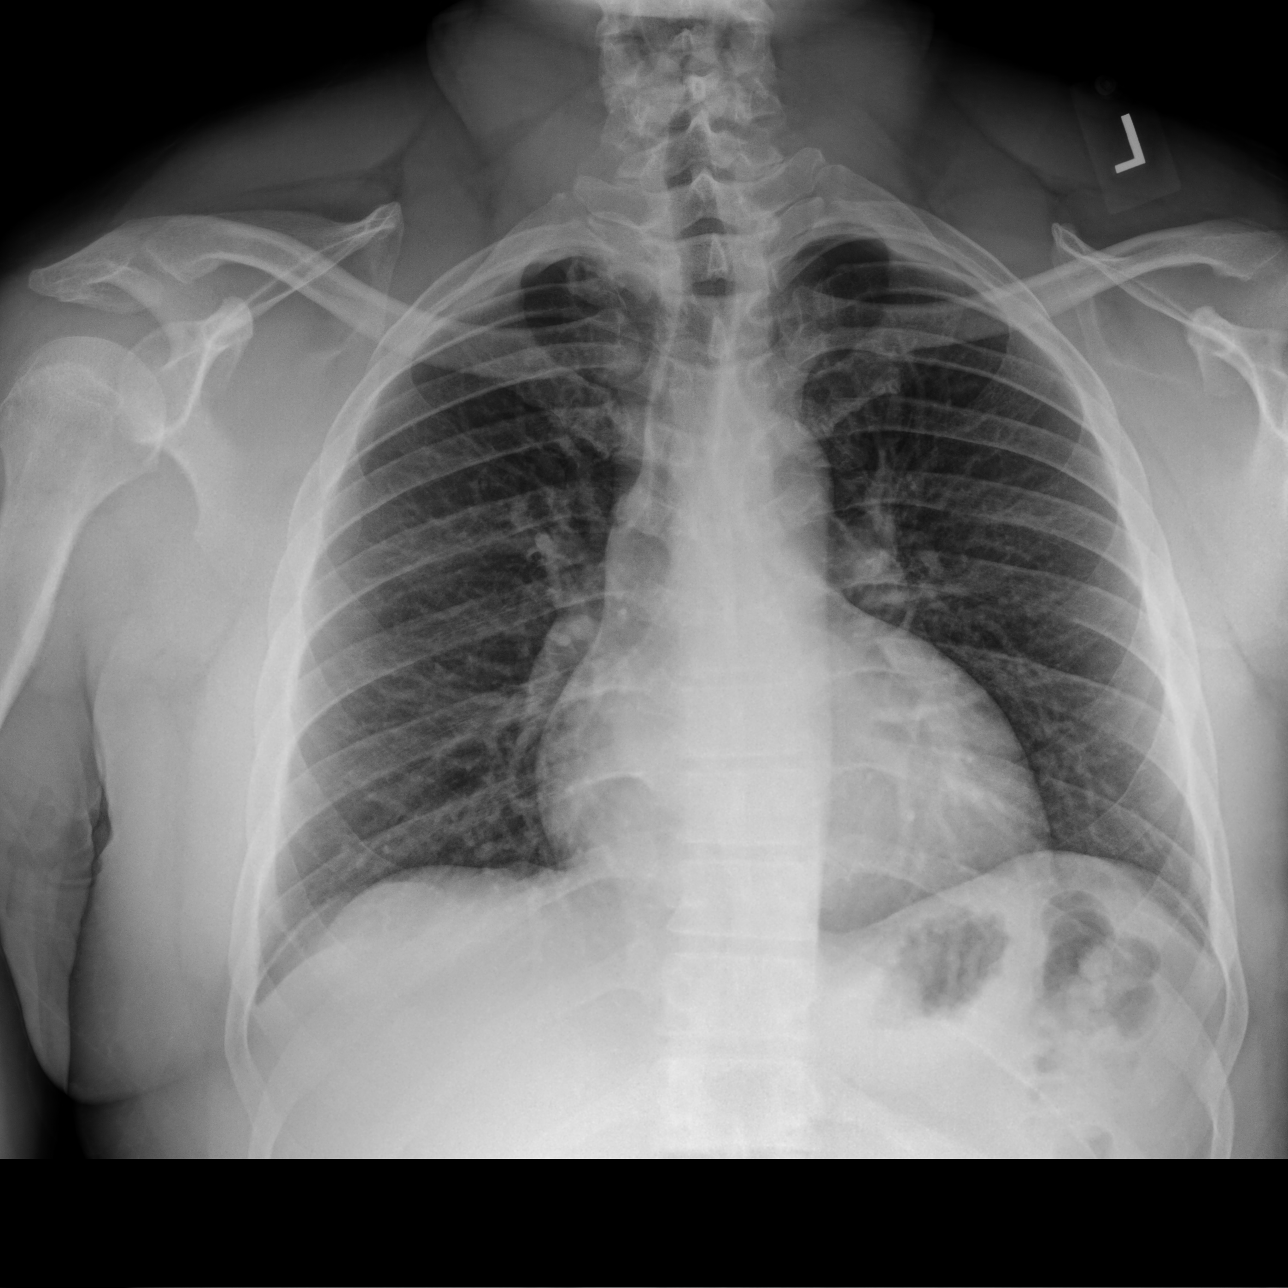

[hemithorax (ribs) ap (1 of 2)]
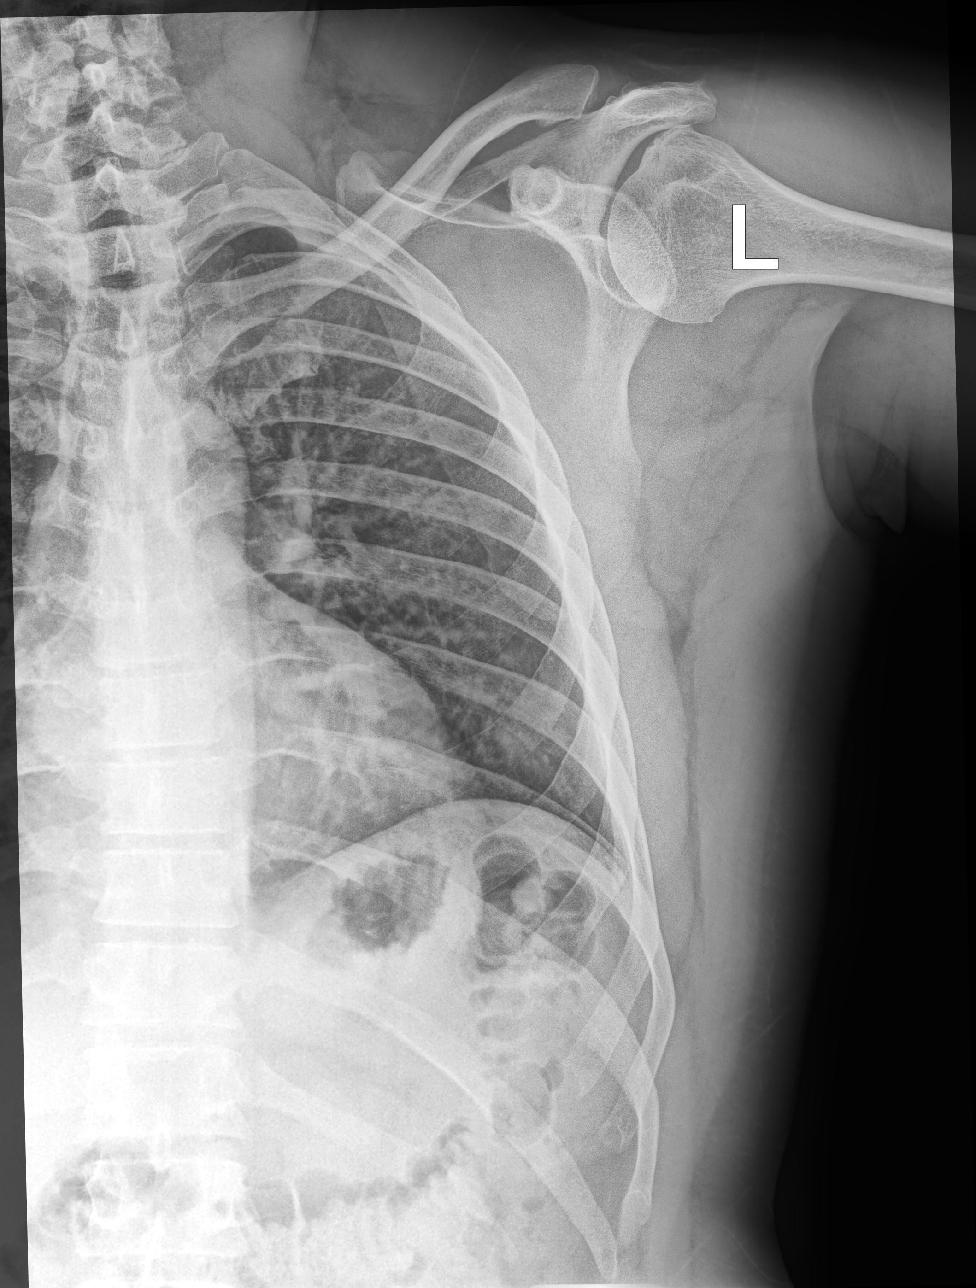

[hemithorax (ribs) mlo]
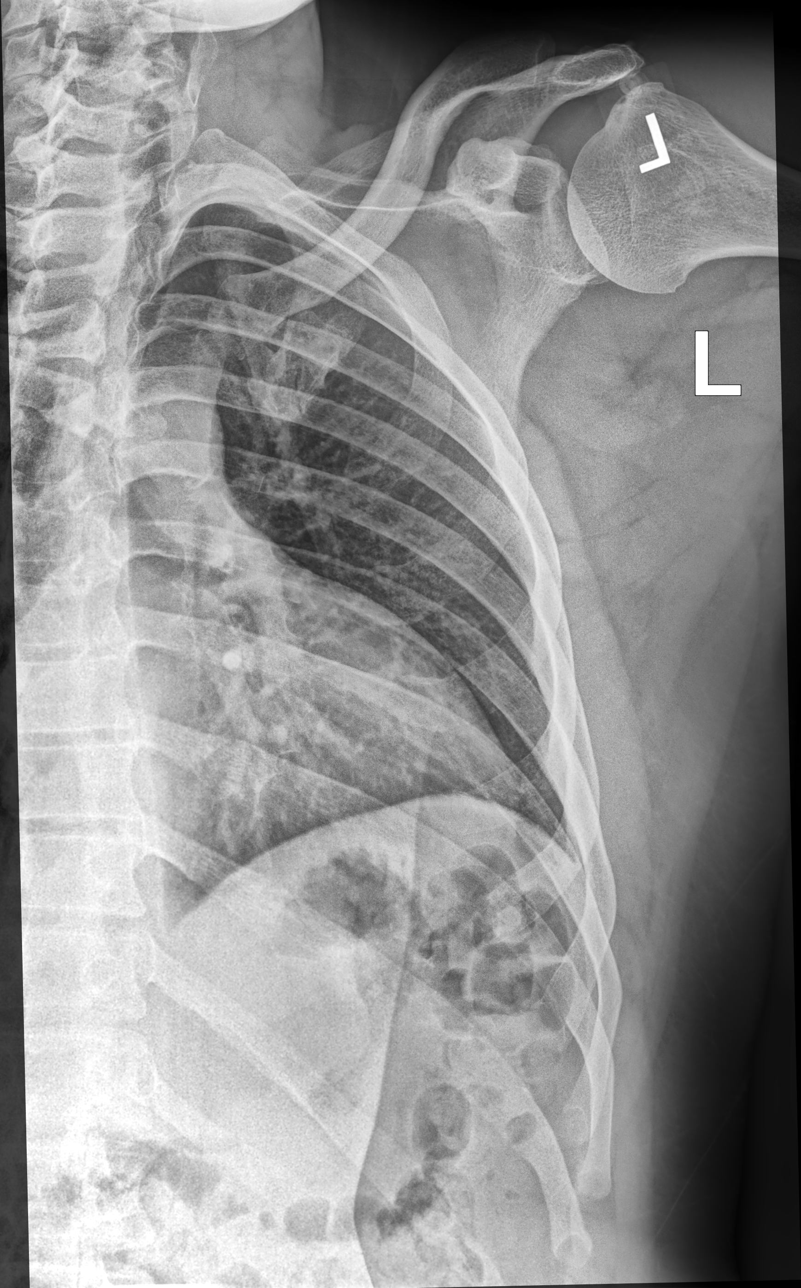

[hemithorax (ribs) ap (2 of 2)]
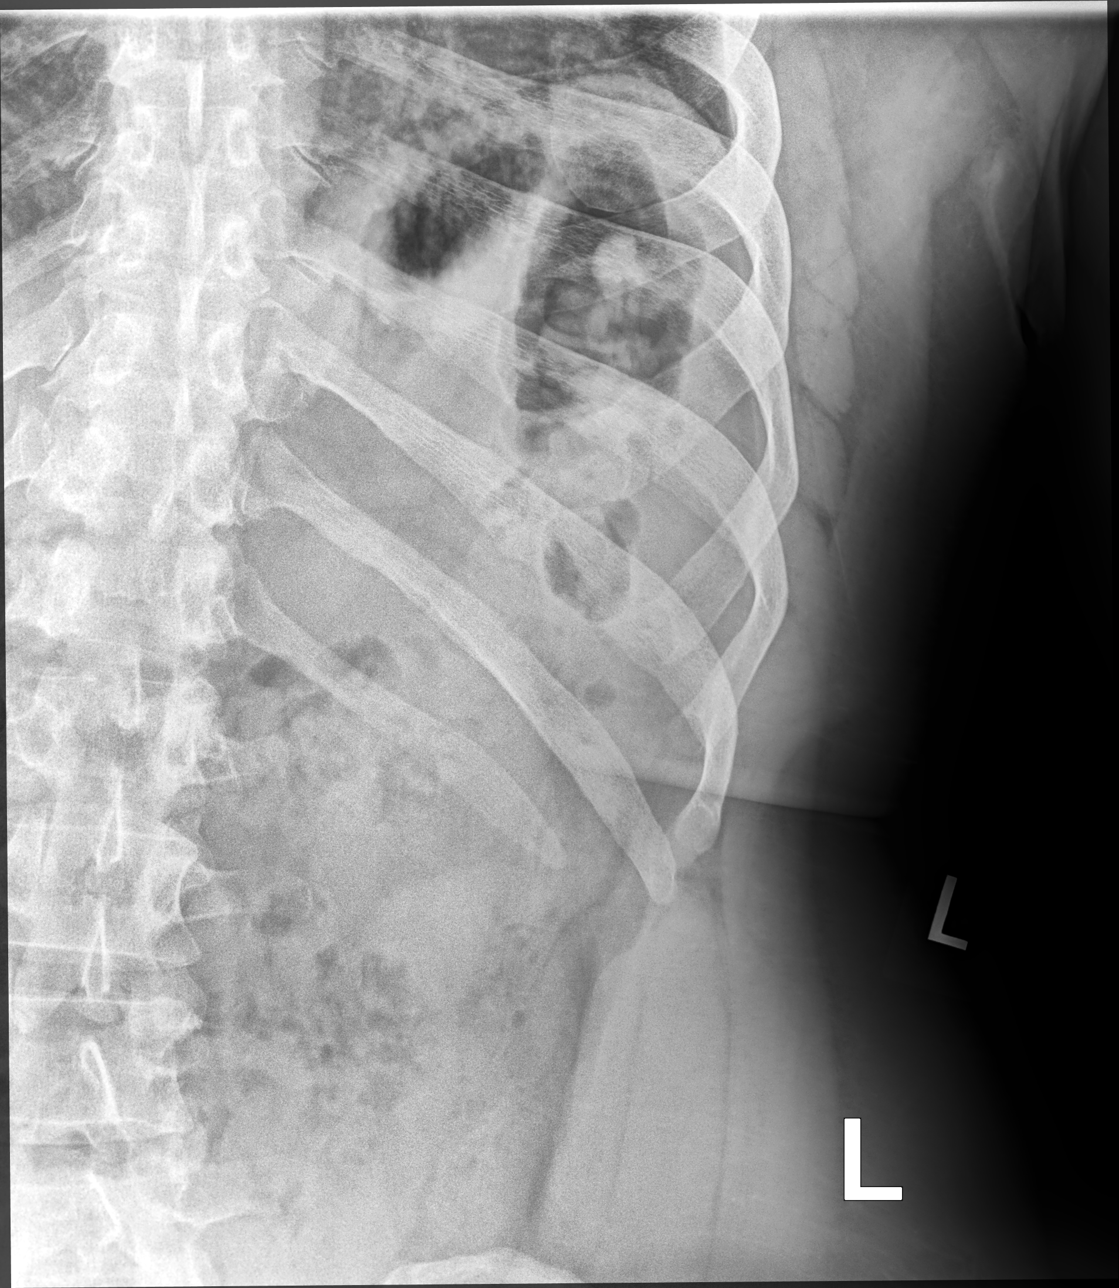

[4 of 4 positions shown; findings below may reference images not displayed]

FINDINGS: Lungs are clear. Heart size is normal. No pneumothorax or pleural
effusion. No fracture or other focal bony abnormality.
IMPRESSION: Negative exam.

## 2020-10-24 ENCOUNTER — Other Ambulatory Visit: Payer: Self-pay | Admitting: Family Medicine

## 2020-10-25 NOTE — Telephone Encounter (Signed)
Lov 01/08/20, when does patient need to come back for office visit?

## 2020-10-28 NOTE — Telephone Encounter (Signed)
Please have patient schedule annual exam/follow-up within the next 4-5 months. Can be as early as he would like. 6 month refill provided and will need to be seen before next refill

## 2020-10-29 NOTE — Telephone Encounter (Signed)
Pt stated he would like a call back in 2-3 hours

## 2021-05-19 ENCOUNTER — Other Ambulatory Visit: Payer: Self-pay | Admitting: Family Medicine

## 2021-06-02 ENCOUNTER — Other Ambulatory Visit: Payer: Self-pay | Admitting: Family Medicine

## 2021-06-03 ENCOUNTER — Telehealth: Payer: Self-pay | Admitting: Family Medicine

## 2021-06-03 MED ORDER — AMLODIPINE BESYLATE 10 MG PO TABS: 10.0000 mg | ORAL_TABLET | Freq: Every day | ORAL | 0 refills | Status: DC

## 2021-06-03 NOTE — Telephone Encounter (Signed)
Patient calling re: amlodopine refill, he is leaving to go out of town on Friday morning early and wants to ensure he has his medication.  Please respond to the patient in my chart, or give him a call

## 2021-06-03 NOTE — Telephone Encounter (Signed)
30 day refill provided

## 2021-06-03 NOTE — Telephone Encounter (Signed)
Encourage patient to contact the pharmacy for refills or they can request refills through [facility]  Did the patient contact the pharmacy: N/A  NEXT APPOINTMENT DATE:  07/08/2021  MEDICATION:  amLODipine (NORVASC) 10 MG tablet  Is the patient out of medication? Not yet  If not, how much is left?  3 left   Is this a 90 day supply: Yes  [id number] [place] [place] [facility] [address]: [phone] Let patient know to contact pharmacy at the end of the day to make sure medication is ready.  Please notify patient to allow 48-72 hours to process

## 2021-06-11 ENCOUNTER — Other Ambulatory Visit: Payer: BC Managed Care – PPO

## 2021-06-18 ENCOUNTER — Encounter: Payer: BC Managed Care – PPO | Admitting: Family Medicine

## 2021-06-28 ENCOUNTER — Other Ambulatory Visit: Payer: Self-pay | Admitting: Family Medicine

## 2021-07-01 ENCOUNTER — Other Ambulatory Visit: Payer: Self-pay | Admitting: Family Medicine

## 2021-07-08 ENCOUNTER — Ambulatory Visit (INDEPENDENT_AMBULATORY_CARE_PROVIDER_SITE_OTHER): Payer: BC Managed Care – PPO | Admitting: Family Medicine

## 2021-07-08 VITALS — BP 150/84 | HR 50 | Temp 97.8°F | Ht 69.0 in | Wt 273.0 lb

## 2021-07-08 DIAGNOSIS — E785 Hyperlipidemia, unspecified: Secondary | ICD-10-CM | POA: Insufficient documentation

## 2021-07-08 DIAGNOSIS — Z1211 Encounter for screening for malignant neoplasm of colon: Secondary | ICD-10-CM

## 2021-07-08 DIAGNOSIS — Z0000 Encounter for general adult medical examination without abnormal findings: Secondary | ICD-10-CM

## 2021-07-08 DIAGNOSIS — R7303 Prediabetes: Secondary | ICD-10-CM

## 2021-07-08 DIAGNOSIS — E782 Mixed hyperlipidemia: Secondary | ICD-10-CM | POA: Diagnosis not present

## 2021-07-08 DIAGNOSIS — I10 Essential (primary) hypertension: Secondary | ICD-10-CM

## 2021-07-08 LAB — HEMOGLOBIN A1C: Hgb A1c MFr Bld: 6.4 % (ref 4.6–6.5)

## 2021-07-08 LAB — COMPREHENSIVE METABOLIC PANEL
ALT: 27 U/L (ref 0–53)
AST: 22 U/L (ref 0–37)
Albumin: 4.1 g/dL (ref 3.5–5.2)
Alkaline Phosphatase: 59 U/L (ref 39–117)
BUN: 17 mg/dL (ref 6–23)
CO2: 26 mEq/L (ref 19–32)
Calcium: 9.5 mg/dL (ref 8.4–10.5)
Chloride: 102 mEq/L (ref 96–112)
Creatinine, Ser: 1.26 mg/dL (ref 0.40–1.50)
GFR: 65.57 mL/min (ref >60.00)
Glucose, Bld: 102 mg/dL — ABNORMAL HIGH (ref 70–99)
Potassium: 4.3 mEq/L (ref 3.5–5.1)
Sodium: 137 mEq/L (ref 135–145)
Total Bilirubin: 0.6 mg/dL (ref 0.2–1.2)
Total Protein: 7.1 g/dL (ref 6.0–8.3)

## 2021-07-08 LAB — LIPID PANEL
Cholesterol: 155 mg/dL (ref 0–200)
HDL: 53.9 mg/dL (ref >39.00)
LDL Cholesterol: 94 mg/dL (ref 0–99)
NonHDL: 101.5
Total CHOL/HDL Ratio: 3
Triglycerides: 38 mg/dL (ref 0.0–149.0)
VLDL: 7.6 mg/dL (ref 0.0–40.0)

## 2021-07-08 LAB — TSH: TSH: 0.59 u[IU]/mL (ref 0.35–5.50)

## 2021-07-08 NOTE — Assessment & Plan Note (Signed)
Encouraged calorie counting, referral to nutritionist.  Encouraged DASH diet.  Patient is already exercising.  He is interested in [place], discussed getting labs as he has prediabetes to evaluate this.  Return in 1 month for check-in and to discuss treatment options

## 2021-07-14 ENCOUNTER — Encounter: Payer: Self-pay | Admitting: Family Medicine

## 2021-07-17 NOTE — Telephone Encounter (Signed)
FYI.   I reviewed your recent office notes which stated to work on calorie counting, see nutritionist, follow up in 1 month for discussion of medication treatment.

## 2021-07-31 ENCOUNTER — Other Ambulatory Visit: Payer: Self-pay | Admitting: Family Medicine

## 2021-08-05 ENCOUNTER — Ambulatory Visit: Payer: BC Managed Care – PPO | Admitting: Family Medicine

## 2021-08-05 DIAGNOSIS — E782 Mixed hyperlipidemia: Secondary | ICD-10-CM

## 2021-08-05 DIAGNOSIS — R7303 Prediabetes: Secondary | ICD-10-CM | POA: Diagnosis not present

## 2021-08-05 DIAGNOSIS — I10 Essential (primary) hypertension: Secondary | ICD-10-CM | POA: Diagnosis not present

## 2021-08-05 MED ORDER — WEGOVY 0.25 MG/0.5ML ~~LOC~~ SOAJ: 0.2500 mg | SUBCUTANEOUS | 0 refills | Status: DC

## 2021-08-05 NOTE — Assessment & Plan Note (Addendum)
Lab Results  Component Value Date   HGBA1C 6.4 07/08/2021  Starting [facility] for weight loss.  If not approved we could consider metformin or Rybelsus.

## 2021-08-05 NOTE — Progress Notes (Signed)
Subjective:     Samuel Wiley is a 51 y.o. male presenting for Follow-up (BP)     HPI  #HTN - Continues to take medication - amlodipine 10 mg and bystolic 5 mg  #obesity - recent attempts - exercise - walk the building at work 1/2 mile walk daily - gardening at home - carrying water around the garden - 1.5 hour daily - diet: beans, meat, broccoli, cabbages, chicken (baked and fried), potatoes - eats what he wants - no calorie counting or portion control - no recent diet plan    Review of Systems  07/08/2021: Clinic - HTN - bystolic 5 mg and amlodipine 10 mg - home monitoring. Obesity - interested in wegovy  Social History   Tobacco Use  Smoking Status Never  Smokeless Tobacco Never        Objective:    BP Readings from Last 3 Encounters:  08/05/21 128/70  07/08/21 (!) 150/84  01/08/20 140/80   Wt Readings from Last 3 Encounters:  08/05/21 271 lb 4 oz (123 kg)  07/08/21 273 lb (123.8 kg)  01/08/20 272 lb (123.4 kg)    BP 128/70   Pulse 67   Temp 97.8 F (36.6 C) (Temporal)   Ht '5\' 9"'$  (1.753 m)   Wt 271 lb 4 oz (123 kg)   SpO2 98%   BMI 40.06 kg/m    Physical Exam Constitutional:      Appearance: Normal appearance. He is not ill-appearing or diaphoretic.  HENT:     Right Ear: External ear normal.     Left Ear: External ear normal.     Nose: Nose normal.  Eyes:     General: No scleral icterus.    Extraocular Movements: Extraocular movements intact.     Conjunctiva/sclera: Conjunctivae normal.  Cardiovascular:     Rate and Rhythm: Normal rate and regular rhythm.     Heart sounds: No murmur heard. Pulmonary:     Effort: Pulmonary effort is normal. No respiratory distress.     Breath sounds: Normal breath sounds. No wheezing.  Musculoskeletal:     Cervical back: Neck supple.  Skin:    General: Skin is warm and dry.  Neurological:     Mental Status: He is alert. Mental status is at baseline.  Psychiatric:        Mood and Affect: Mood  normal.           Assessment & Plan:   Problem List Items Addressed This Visit       Cardiovascular and Mediastinum   Essential hypertension    Blood pressure at goal.  Continue amlodipine 10 mg and Bystolic 5 mg.      Relevant Medications   Semaglutide-Weight Management (WEGOVY) 0.25 MG/0.5ML SOAJ     Other   Morbid obesity ([place]) - Primary    Patient with BMI of 40, and complications of obesity including hypertension and high cholesterol and prediabetes.  He is exercising regularly through gardening.  His diet could be improved he has not tried calorie counting in the past.  He is interested in medication to help curb appetite.  Prescription for [facility] sent to pharmacy per request.  Discussed if this is not covered we could consider metformin due to prediabetes or Rybelsus.  Also offered referral to nutrition, which she declined today.  S potential referral referral to healthy weight and wellness as well.  He will update in 1 month with how he is doing with Wegovy.  Return in 3  months for weight check      Relevant Medications   Semaglutide-Weight Management (WEGOVY) 0.25 MG/0.5ML SOAJ   Prediabetes    Lab Results  Component Value Date   HGBA1C 6.4 07/08/2021  Starting [facility] for weight loss.  If not approved we could consider metformin or Rybelsus.       Relevant Medications   Semaglutide-Weight Management (WEGOVY) 0.25 MG/0.5ML SOAJ   Hyperlipidemia   Relevant Medications   Semaglutide-Weight Management (WEGOVY) 0.25 MG/0.5ML SOAJ     Return in about 3 months (around 11/05/2021) for for weight loss - with Romilda Garret for TOC .  Lesleigh Noe, MD

## 2021-08-05 NOTE — Assessment & Plan Note (Signed)
Blood pressure at goal.  Continue amlodipine 10 mg and Bystolic 5 mg.

## 2021-08-05 NOTE — Patient Instructions (Addendum)
Weight loss - Start Wegovy 0.25 mg - After 1 month - send mychart update with whether you feel ready to increase the dose.    If not covered options:  1) Metformin 2) Rybelsus or Saxenda (might be covered) 3) Healthy weight and wellness

## 2021-08-05 NOTE — Assessment & Plan Note (Signed)
Patient with BMI of 40, and complications of obesity including hypertension and high cholesterol and prediabetes.  He is exercising regularly through gardening.  His diet could be improved he has not tried calorie counting in the past.  He is interested in medication to help curb appetite.  Prescription for [facility] sent to pharmacy per request.  Discussed if this is not covered we could consider metformin due to prediabetes or Rybelsus.  Also offered referral to nutrition, which she declined today.  S potential referral referral to healthy weight and wellness as well.  He will update in 1 month with how he is doing with Wegovy.  Return in 3 months for weight check

## 2021-08-06 ENCOUNTER — Telehealth: Payer: Self-pay

## 2021-08-06 NOTE — Telephone Encounter (Signed)
Prior auth started and approved for [facility] 0.'25MG'$ /0.5ML auto-injectors. Burgess Estelle (Key: [id number]) Rx #: [phone]  Your prior authorization for Mancel Parsons has been approved!  Message from plan: CaseId:[id number];Status:Approved;Review Type:Prior Auth;Coverage Start Date:07/07/2021;Coverage End Date:03/04/2022;

## 2021-08-19 ENCOUNTER — Encounter: Payer: Self-pay | Admitting: Family Medicine

## 2021-08-19 DIAGNOSIS — I10 Essential (primary) hypertension: Secondary | ICD-10-CM

## 2021-08-25 ENCOUNTER — Other Ambulatory Visit: Payer: Self-pay | Admitting: Family Medicine

## 2021-08-26 MED ORDER — HYDROCHLOROTHIAZIDE 25 MG PO TABS: 25.0000 mg | ORAL_TABLET | Freq: Every day | ORAL | 0 refills | Status: DC

## 2021-08-26 NOTE — Addendum Note (Signed)
Addended by: Lesleigh Noe on: 08/26/2021 02:14 PM   Modules accepted: Orders

## 2021-08-29 ENCOUNTER — Other Ambulatory Visit: Payer: Self-pay | Admitting: Family Medicine

## 2021-09-25 ENCOUNTER — Telehealth: Payer: Self-pay | Admitting: Family Medicine

## 2021-09-25 NOTE — Telephone Encounter (Signed)
My chart message sent to pt.

## 2021-10-01 NOTE — Telephone Encounter (Signed)
Called pt, to schedule lab work. Pt stated he wasn't taking any medication named  hydrochlorothiazide (HYDRODIURIL) 25 MG tablet.

## 2021-10-03 NOTE — Telephone Encounter (Signed)
Patient has been scheduled

## 2021-10-06 ENCOUNTER — Other Ambulatory Visit (INDEPENDENT_AMBULATORY_CARE_PROVIDER_SITE_OTHER): Payer: BC Managed Care – PPO

## 2021-10-06 DIAGNOSIS — I10 Essential (primary) hypertension: Secondary | ICD-10-CM | POA: Diagnosis not present

## 2021-10-06 LAB — BASIC METABOLIC PANEL
BUN: 15 mg/dL (ref 6–23)
CO2: 30 mEq/L (ref 19–32)
Calcium: 9.4 mg/dL (ref 8.4–10.5)
Chloride: 99 mEq/L (ref 96–112)
Creatinine, Ser: 1.26 mg/dL (ref 0.40–1.50)
GFR: 65.45 mL/min (ref >60.00)
Glucose, Bld: 127 mg/dL — ABNORMAL HIGH (ref 70–99)
Potassium: 3.8 mEq/L (ref 3.5–5.1)
Sodium: 137 mEq/L (ref 135–145)

## 2021-12-08 ENCOUNTER — Other Ambulatory Visit: Payer: Self-pay

## 2021-12-08 ENCOUNTER — Telehealth: Payer: Self-pay

## 2021-12-08 NOTE — Telephone Encounter (Signed)
LVM for patient to call and schedule

## 2021-12-08 NOTE — Telephone Encounter (Signed)
Patient needs to be set up for [facility] appointment. Once made let me know so I can submit for refill on HCTZ.

## 2021-12-09 ENCOUNTER — Other Ambulatory Visit: Payer: Self-pay | Admitting: Family Medicine

## 2021-12-09 NOTE — Telephone Encounter (Signed)
Caller Name: Gay Call back phone #: [phone]  MEDICATION(S):  hydrochlorothiazide (HYDRODIURIL) 25 MG tablet   Days of Med Remaining: 0  Has the patient contacted their pharmacy (YES/NO)? NO What did pharmacy advise?   [facility]  [id number] [place] [place] - [address]  ~~~Please advise patient/caregiver to allow 2-3 business days to process RX refills.

## 2021-12-10 MED ORDER — HYDROCHLOROTHIAZIDE 25 MG PO TABS: 25.0000 mg | ORAL_TABLET | Freq: Every day | ORAL | 0 refills | Status: DC

## 2021-12-10 NOTE — Telephone Encounter (Signed)
Patient last refill was in September. Does not look like he has been taking daily ok to refill?

## 2021-12-10 NOTE — Telephone Encounter (Signed)
Patient has appointment. Duplicate message was started on patient.I have sent that one for review from provider for refill.  No further action needed at this time.

## 2021-12-15 DIAGNOSIS — Z125 Encounter for screening for malignant neoplasm of prostate: Secondary | ICD-10-CM | POA: Diagnosis not present

## 2021-12-22 DIAGNOSIS — R3915 Urgency of urination: Secondary | ICD-10-CM | POA: Diagnosis not present

## 2021-12-22 DIAGNOSIS — N5201 Erectile dysfunction due to arterial insufficiency: Secondary | ICD-10-CM | POA: Diagnosis not present

## 2021-12-22 DIAGNOSIS — Z125 Encounter for screening for malignant neoplasm of prostate: Secondary | ICD-10-CM | POA: Diagnosis not present

## 2022-01-10 ENCOUNTER — Other Ambulatory Visit: Payer: Self-pay | Admitting: Family

## 2022-01-30 ENCOUNTER — Encounter: Payer: Self-pay | Admitting: Nurse Practitioner

## 2022-01-30 ENCOUNTER — Ambulatory Visit: Payer: BC Managed Care – PPO | Admitting: Nurse Practitioner

## 2022-01-30 VITALS — BP 162/98 | HR 66 | Ht 69.0 in | Wt 259.0 lb

## 2022-01-30 DIAGNOSIS — Z23 Encounter for immunization: Secondary | ICD-10-CM

## 2022-01-30 DIAGNOSIS — R7303 Prediabetes: Secondary | ICD-10-CM

## 2022-01-30 DIAGNOSIS — I10 Essential (primary) hypertension: Secondary | ICD-10-CM

## 2022-01-30 DIAGNOSIS — N400 Enlarged prostate without lower urinary tract symptoms: Secondary | ICD-10-CM

## 2022-01-30 DIAGNOSIS — Z1211 Encounter for screening for malignant neoplasm of colon: Secondary | ICD-10-CM

## 2022-01-30 DIAGNOSIS — E785 Hyperlipidemia, unspecified: Secondary | ICD-10-CM

## 2022-01-30 DIAGNOSIS — G4730 Sleep apnea, unspecified: Secondary | ICD-10-CM | POA: Diagnosis not present

## 2022-01-30 MED ORDER — ZEPBOUND 2.5 MG/0.5ML ~~LOC~~ SOAJ: 2.5000 mg | SUBCUTANEOUS | 0 refills | Status: DC

## 2022-01-30 MED ORDER — HYDROCHLOROTHIAZIDE 25 MG PO TABS: 25.0000 mg | ORAL_TABLET | Freq: Every day | ORAL | 0 refills | Status: DC

## 2022-01-30 NOTE — Assessment & Plan Note (Signed)
Patient has worked on lifestyle modifications and also weight.  Will aid in sending in zepbound patient has no family or personal history of medullary thyroid cancer, multiple endocrine neoplasia syndrome type II or pancreatitis.

## 2022-01-30 NOTE — Assessment & Plan Note (Signed)
Patient currently maintained on Bystolic and hydrochlorothiazide.  Has been elbow HCTZ for approximately 3 weeks.  Will restart medication check labs today recheck BMP at office visit 1 month along with blood pressure.

## 2022-01-30 NOTE — Assessment & Plan Note (Signed)
Continue working on healthy lifestyle modifications 

## 2022-01-30 NOTE — Progress Notes (Signed)
Established Patient Office Visit  Subjective   Patient ID: Samuel Wiley, male    DOB: Jun 24, 1970  Age: 52 y.o. MRN: [id number]  Chief Complaint  Patient presents with   Establish Care    HPI  Transfer of Care: last seen by Waunita Schooner, MD on 08/05/2021. Last CPE was on 07/08/2021  HTN: patient was on bystolic and amlodipine. BP was well controlled. He was switched from amlodpine to hctz and continued bystolic. States that he is out of hctz for approx 3 weeks.  OSA: States that he has had a UVP and still uses the CPAP still. States last titration was 10. States that he gets 4 hours with it  BPH: Dr T[facility] every 6 months to a year.  Prediabetes: States that he was  States that he is walking. States that he will walk around  the parking lot 2-3 times a week. He will garden. Diet: 2 meals a d ay. States that he will also  Flax and oats in the morning then 10 hours later dinner is almost always cooked. Meats and fried food. Water and sometimes he will have some coffee  Colonoscopy; AMb refer to [facility] Flu: update today Shingles vaccine: info discussed Covid: refused    Review of Systems  Constitutional:  Negative for chills and fever.  Respiratory:  Negative for shortness of breath.   Cardiovascular:  Negative for chest pain and leg swelling.  Gastrointestinal:  Negative for abdominal pain, blood in stool, constipation, diarrhea, nausea and vomiting.       Bm daily   Genitourinary:  Negative for dysuria and hematuria.  Neurological:  Negative for headaches.  Psychiatric/Behavioral:  Negative for hallucinations and suicidal ideas.       Objective:     BP (!) 162/98   Pulse 66   Ht '5\' 9"'$  (1.753 m)   Wt 259 lb (117.5 kg)   SpO2 98%   BMI 38.25 kg/m  BP Readings from Last 3 Encounters:  01/30/22 (!) 162/98  08/05/21 128/70  07/08/21 (!) 150/84   Wt Readings from Last 3 Encounters:  01/30/22 259 lb (117.5 kg)  08/05/21 271 lb 4 oz (123 kg)  07/08/21  273 lb (123.8 kg)      Physical Exam Vitals and nursing note reviewed.  Constitutional:      Appearance: Normal appearance.  HENT:     Right Ear: Tympanic membrane, ear canal and external ear normal.     Left Ear: Tympanic membrane, ear canal and external ear normal.     Mouth/Throat:     Mouth: Mucous membranes are moist.     Pharynx: Oropharynx is clear.  Eyes:     Extraocular Movements: Extraocular movements intact.     Pupils: Pupils are equal, round, and reactive to light.  Cardiovascular:     Rate and Rhythm: Normal rate and regular rhythm.     Heart sounds: Normal heart sounds.  Pulmonary:     Effort: Pulmonary effort is normal.     Breath sounds: Normal breath sounds.  Musculoskeletal:     Right lower leg: No edema.     Left lower leg: No edema.  Lymphadenopathy:     Cervical: No cervical adenopathy.  Neurological:     Mental Status: He is alert.      No results found for any visits on 01/30/22.    The 10-year ASCVD risk score (Arnett DK, et al., 2019) is: 14%    Assessment & Plan:   Problem  List Items Addressed This Visit       Cardiovascular and Mediastinum   Essential hypertension - Primary    Patient currently maintained on Bystolic and hydrochlorothiazide.  Has been elbow HCTZ for approximately 3 weeks.  Will restart medication check labs today recheck BMP at office visit 1 month along with blood pressure.      Relevant Medications   hydrochlorothiazide (HYDRODIURIL) 25 MG tablet   tirzepatide (ZEPBOUND) 2.5 MG/0.5ML Pen   Other Relevant Orders   CBC   Comprehensive metabolic panel     Respiratory   Sleep apnea    Patient had UVP and with CPAP.  Continue both      Relevant Medications   tirzepatide (ZEPBOUND) 2.5 MG/0.5ML Pen     Genitourinary   BPH (benign prostatic hyperplasia)    Patient is followed by Dr. Bess Harvest at [facility] urology.  Continue following up with him as recommended.  Continue finasteride, terazosin, oxybutynin         Other   Morbid obesity ([place])    Patient has worked on lifestyle modifications and also weight.  Will aid in sending in zepbound patient has no family or personal history of medullary thyroid cancer, multiple endocrine neoplasia syndrome type II or pancreatitis.      Relevant Medications   tirzepatide (ZEPBOUND) 2.5 MG/0.5ML Pen   Other Relevant Orders   Hemoglobin A1c   Prediabetes    Pending A1c today.  Patient has lost some weight on his own.  Prescription for Zepbound      Relevant Medications   tirzepatide (ZEPBOUND) 2.5 MG/0.5ML Pen   Other Relevant Orders   Hemoglobin A1c   Hyperlipidemia    Continue working on healthy lifestyle modifications.      Relevant Medications   hydrochlorothiazide (HYDRODIURIL) 25 MG tablet   tirzepatide (ZEPBOUND) 2.5 MG/0.5ML Pen   Other Visit Diagnoses     Screening for colon cancer       Relevant Orders   Ambulatory referral to Gastroenterology   Need for influenza vaccination           Return in about 4 weeks (around 02/27/2022) for BP recheck/BMP recheck .    Romilda Garret, NP

## 2022-01-30 NOTE — Assessment & Plan Note (Signed)
Patient had UVP and with CPAP.  Continue both

## 2022-01-30 NOTE — Patient Instructions (Addendum)
Nice to see you today I will be in touch with the labs once I have the results Follow up with me in 1 month for a kidney check and blood pressure check.

## 2022-01-30 NOTE — Assessment & Plan Note (Signed)
Pending A1c today.  Patient has lost some weight on his own.  Prescription for Zepbound

## 2022-01-30 NOTE — Assessment & Plan Note (Signed)
Patient is followed by Dr. Bess Harvest at [facility] urology.  Continue following up with him as recommended.  Continue finasteride, terazosin, oxybutynin

## 2022-01-31 ENCOUNTER — Encounter: Payer: Self-pay | Admitting: Nurse Practitioner

## 2022-01-31 LAB — COMPREHENSIVE METABOLIC PANEL
AG Ratio: 1.3 (calc) (ref 1.0–2.5)
ALT: 31 U/L (ref 9–46)
AST: 22 U/L (ref 10–35)
Albumin: 4 g/dL (ref 3.6–5.1)
Alkaline phosphatase (APISO): 57 U/L (ref 35–144)
BUN: 12 mg/dL (ref 7–25)
CO2: 25 mmol/L (ref 20–32)
Calcium: 9.4 mg/dL (ref 8.6–10.3)
Chloride: 104 mmol/L (ref 98–110)
Creat: 1.19 mg/dL (ref 0.70–1.30)
Globulin: 3.2 g/dL (calc) (ref 1.9–3.7)
Glucose, Bld: 94 mg/dL (ref 65–99)
Potassium: 4.6 mmol/L (ref 3.5–5.3)
Sodium: 138 mmol/L (ref 135–146)
Total Bilirubin: 0.5 mg/dL (ref 0.2–1.2)
Total Protein: 7.2 g/dL (ref 6.1–8.1)

## 2022-01-31 LAB — CBC
HCT: 40.3 % (ref 38.5–50.0)
Hemoglobin: 13.6 g/dL (ref 13.2–17.1)
MCH: 28 pg (ref 27.0–33.0)
MCHC: 33.7 g/dL (ref 32.0–36.0)
MCV: 83.1 fL (ref 80.0–100.0)
MPV: 11.3 fL (ref 7.5–12.5)
Platelets: 231 10*3/uL (ref 140–400)
RBC: 4.85 10*6/uL (ref 4.20–5.80)
RDW: 13 % (ref 11.0–15.0)
WBC: 3.7 10*3/uL — ABNORMAL LOW (ref 3.8–10.8)

## 2022-01-31 LAB — HEMOGLOBIN A1C
Hgb A1c MFr Bld: 6.4 % of total Hgb — ABNORMAL HIGH (ref <5.7)
Mean Plasma Glucose: 137 mg/dL
eAG (mmol/L): 7.6 mmol/L

## 2022-02-04 ENCOUNTER — Telehealth: Payer: Self-pay

## 2022-02-04 ENCOUNTER — Other Ambulatory Visit (HOSPITAL_COMMUNITY): Payer: Self-pay

## 2022-02-04 ENCOUNTER — Other Ambulatory Visit: Payer: Self-pay

## 2022-02-04 DIAGNOSIS — Z1211 Encounter for screening for malignant neoplasm of colon: Secondary | ICD-10-CM

## 2022-02-04 MED ORDER — NA SULFATE-K SULFATE-MG SULF 17.5-3.13-1.6 GM/177ML PO SOLN: 1.0000 | Freq: Once | ORAL | 0 refills | Status: AC

## 2022-02-04 NOTE — Telephone Encounter (Signed)
Patient Advocate Encounter   Received notification from [employer] that prior authorization for Zepbound 2.'5MG'$ /0.5ML is required.   PA submitted on 02/04/2022 Key  [id number] Status is pending

## 2022-02-04 NOTE — Telephone Encounter (Signed)
Pharmacy Patient Advocate Encounter  Prior Authorization for Zebound has been approved.    PA# [id number] Effective dates: 01/05/2022 through 10/02/2022

## 2022-02-04 NOTE — Telephone Encounter (Signed)
Gastroenterology Pre-Procedure Review  Request Date: 03/06/22 Requesting Physician: Dr. Vicente Males  PATIENT REVIEW QUESTIONS: The patient responded to the following health history questions as indicated:    1. Are you having any GI issues? no 2. Do you have a personal history of Polyps? no 3. Do you have a family history of Colon Cancer or Polyps? no 4. Diabetes Mellitus? no 5. Joint replacements in the past 12 months?no 6. Major health problems in the past 3 months?no 7. Any artificial heart valves, MVP, or defibrillator?no    MEDICATIONS & ALLERGIES:    Patient reports the following regarding taking any anticoagulation/antiplatelet therapy:   Plavix, Coumadin, Eliquis, Xarelto, Lovenox, Pradaxa, Brilinta, or Effient? no Aspirin? no  Patient confirms/reports the following medications:  Current Outpatient Medications  Medication Sig Dispense Refill   Na Sulfate-K Sulfate-Mg Sulf 17.5-3.13-1.6 GM/177ML SOLN Take 1 kit by mouth once for 1 dose. 354 mL 0   amLODipine (NORVASC) 10 MG tablet Take 1 tablet (10 mg total) by mouth daily. (Patient not taking: Reported on 01/30/2022) 90 tablet 1   arginine 500 MG tablet Take 1,000 mg by mouth 3 (three) times daily.      aspirin 81 MG tablet Take 81 mg by mouth daily. (Patient not taking: Reported on 01/30/2022)     finasteride (PROSCAR) 5 MG tablet Take 5 mg by mouth daily.     hydrochlorothiazide (HYDRODIURIL) 25 MG tablet Take 1 tablet (25 mg total) by mouth daily. 90 tablet 0   nebivolol (BYSTOLIC) 5 MG tablet TAKE 1 TABLET BY MOUTH EVERY DAY 90 tablet 3   oxybutynin (DITROPAN) 5 MG tablet TAKE 1 TABLET BY MOUTH EVERY 8 HOURS AS NEEDED FOR BLADDER SPASMS     Sildenafil Citrate (VIAGRA PO) Take by mouth as needed.     terazosin (HYTRIN) 10 MG capsule Take 1 capsule (10 mg total) by mouth in the morning and at bedtime. 60 capsule 0   tirzepatide (ZEPBOUND) 2.5 MG/0.5ML Pen Inject 2.5 mg into the skin once a week. (Patient not taking: Reported on  02/04/2022) 2 mL 0   No current facility-administered medications for this visit.    Patient confirms/reports the following allergies:  Allergies  Allergen Reactions   Atorvastatin Calcium     Cognitive issues   Lisinopril Cough   Milk Protein    Clavulanic Acid Diarrhea    No orders of the defined types were placed in this encounter.   AUTHORIZATION INFORMATION Primary Insurance: 1D#: Group #:  Secondary Insurance: 1D#: Group #:  SCHEDULE INFORMATION: Date: 03/06/22 Time: Location: [facility]

## 2022-02-25 ENCOUNTER — Other Ambulatory Visit (HOSPITAL_COMMUNITY): Payer: Self-pay

## 2022-03-05 ENCOUNTER — Encounter: Payer: Self-pay | Admitting: Gastroenterology

## 2022-03-06 ENCOUNTER — Ambulatory Visit: Payer: BC Managed Care – PPO | Admitting: Certified Registered"

## 2022-03-06 ENCOUNTER — Ambulatory Visit
Admission: RE | Admit: 2022-03-06 | Discharge: 2022-03-06 | Disposition: A | Discharge status: Home / Self Care | Payer: BC Managed Care – PPO | Source: Ambulatory Visit | Attending: Gastroenterology | Admitting: Gastroenterology

## 2022-03-06 ENCOUNTER — Encounter
Admission: RE | Disposition: A | Discharge status: Home / Self Care | Payer: Self-pay | Source: Ambulatory Visit | Attending: Gastroenterology

## 2022-03-06 DIAGNOSIS — I340 Nonrheumatic mitral (valve) insufficiency: Secondary | ICD-10-CM | POA: Insufficient documentation

## 2022-03-06 DIAGNOSIS — Z6838 Body mass index (BMI) 38.0-38.9, adult: Secondary | ICD-10-CM | POA: Insufficient documentation

## 2022-03-06 DIAGNOSIS — E785 Hyperlipidemia, unspecified: Secondary | ICD-10-CM | POA: Diagnosis not present

## 2022-03-06 DIAGNOSIS — I10 Essential (primary) hypertension: Secondary | ICD-10-CM | POA: Diagnosis not present

## 2022-03-06 DIAGNOSIS — Z1211 Encounter for screening for malignant neoplasm of colon: Secondary | ICD-10-CM | POA: Insufficient documentation

## 2022-03-06 DIAGNOSIS — K5730 Diverticulosis of large intestine without perforation or abscess without bleeding: Secondary | ICD-10-CM | POA: Diagnosis not present

## 2022-03-06 DIAGNOSIS — F419 Anxiety disorder, unspecified: Secondary | ICD-10-CM | POA: Insufficient documentation

## 2022-03-06 DIAGNOSIS — D124 Benign neoplasm of descending colon: Secondary | ICD-10-CM | POA: Insufficient documentation

## 2022-03-06 DIAGNOSIS — K635 Polyp of colon: Secondary | ICD-10-CM | POA: Diagnosis not present

## 2022-03-06 DIAGNOSIS — I252 Old myocardial infarction: Secondary | ICD-10-CM | POA: Diagnosis not present

## 2022-03-06 DIAGNOSIS — Z0000 Encounter for general adult medical examination without abnormal findings: Secondary | ICD-10-CM

## 2022-03-06 DIAGNOSIS — D126 Benign neoplasm of colon, unspecified: Secondary | ICD-10-CM | POA: Diagnosis not present

## 2022-03-06 DIAGNOSIS — K219 Gastro-esophageal reflux disease without esophagitis: Secondary | ICD-10-CM | POA: Insufficient documentation

## 2022-03-06 DIAGNOSIS — F32A Depression, unspecified: Secondary | ICD-10-CM | POA: Insufficient documentation

## 2022-03-06 DIAGNOSIS — G4730 Sleep apnea, unspecified: Secondary | ICD-10-CM | POA: Diagnosis not present

## 2022-03-06 DIAGNOSIS — D122 Benign neoplasm of ascending colon: Secondary | ICD-10-CM | POA: Diagnosis not present

## 2022-03-06 DIAGNOSIS — D125 Benign neoplasm of sigmoid colon: Secondary | ICD-10-CM | POA: Insufficient documentation

## 2022-03-06 DIAGNOSIS — E669 Obesity, unspecified: Secondary | ICD-10-CM | POA: Diagnosis not present

## 2022-03-06 HISTORY — PX: COLONOSCOPY WITH PROPOFOL: SHX5780

## 2022-03-06 SURGERY — COLONOSCOPY WITH PROPOFOL
Anesthesia: General

## 2022-03-06 MED ORDER — DEXMEDETOMIDINE HCL IN NACL 200 MCG/50ML IV SOLN
INTRAVENOUS | Status: DC | PRN
  Administered 2022-03-06: 20 ug via INTRAVENOUS

## 2022-03-06 MED ORDER — PROPOFOL 10 MG/ML IV BOLUS
INTRAVENOUS | Status: DC | PRN
  Administered 2022-03-06: 70 mg via INTRAVENOUS
  Administered 2022-03-06: 30 mg via INTRAVENOUS

## 2022-03-06 MED ORDER — SODIUM CHLORIDE 0.9 % IV SOLN: INTRAVENOUS | Status: DC

## 2022-03-06 MED ORDER — PROPOFOL 1000 MG/100ML IV EMUL
INTRAVENOUS | Status: AC
  Filled 2022-03-06: qty 100

## 2022-03-06 MED ORDER — PROPOFOL 500 MG/50ML IV EMUL
INTRAVENOUS | Status: DC | PRN
  Administered 2022-03-06: 150 ug/kg/min via INTRAVENOUS

## 2022-03-06 MED ORDER — PROPOFOL 10 MG/ML IV BOLUS
INTRAVENOUS | Status: AC
  Filled 2022-03-06: qty 40

## 2022-03-06 NOTE — Op Note (Signed)
[facility] Gastroenterology Patient Name: Samuel Wiley Procedure Date: 03/06/2022 8:54 AM MRN: [id number] Account #: [id number] Date of Birth: Mar 03, 1970 Admit Type: Outpatient Age: 52 Room: [facility] ENDO ROOM 4 Gender: Male Note Status: Finalized Instrument Name: Jasper Riling [id number] Procedure:             Colonoscopy Indications:           Screening for colorectal malignant neoplasm Providers:             Jonathon Bellows MD, MD Referring MD:          Alyson Locket. Charmian Muff (Referring MD) Medicines:             Monitored Anesthesia Care Complications:         No immediate complications. Procedure:             Pre-Anesthesia Assessment:                        - Prior to the procedure, a History and Physical was                         performed, and patient medications, allergies and                         sensitivities were reviewed. The patient's tolerance                         of previous anesthesia was reviewed.                        - The risks and benefits of the procedure and the                         sedation options and risks were discussed with the                         patient. All questions were answered and informed                         consent was obtained.                        - ASA Grade Assessment: II - A patient with mild                         systemic disease.                        After obtaining informed consent, the colonoscope was                         passed under direct vision. Throughout the procedure,                         the patient's blood pressure, pulse, and oxygen                         saturations were monitored continuously. The                         Colonoscope was introduced  through the anus and                         advanced to the the cecum, identified by the                         appendiceal orifice. The colonoscopy was performed                         with ease. The patient tolerated the procedure well.                          The quality of the bowel preparation was excellent.                         The ileocecal valve, appendiceal orifice, and rectum                         were photographed. Findings:      The perianal and digital rectal examinations were normal.      Three sessile polyps were found in the sigmoid colon, descending colon       and ascending colon. The polyps were 4 to 5 mm in size. These polyps       were removed with a cold snare. Resection and retrieval were complete.      Multiple small-mouthed diverticula were found in the left colon and       right colon.      The exam was otherwise without abnormality on direct and retroflexion       views. Impression:            - Three 4 to 5 mm polyps in the sigmoid colon, in the                         descending colon and in the ascending colon, removed                         with a cold snare. Resected and retrieved.                        - Diverticulosis in the left colon and in the right                         colon.                        - The examination was otherwise normal on direct and                         retroflexion views. Recommendation:        - Discharge patient to home (with escort).                        - Resume previous diet.                        - Continue present medications.                        - Await pathology results.                        -  Repeat colonoscopy for surveillance based on                         pathology results. Procedure Code(s):     --- Professional ---                        [phone], Colonoscopy, flexible; with removal of                         tumor(s), polyp(s), or other lesion(s) by snare                         technique Diagnosis Code(s):     --- Professional ---                        Z12.11, Encounter for screening for malignant neoplasm                         of colon                        D12.5, Benign neoplasm of sigmoid colon                        D12.4,  Benign neoplasm of descending colon                        D12.2, Benign neoplasm of ascending colon                        K57.30, Diverticulosis of large intestine without                         perforation or abscess without bleeding CPT copyright 2022 American Medical Association. All rights reserved. The codes documented in this report are preliminary and upon coder review may  be revised to meet current compliance requirements. Jonathon Bellows, MD Jonathon Bellows MD, MD 03/06/2022 9:25:07 AM This report has been signed electronically. Number of Addenda: 0 Note Initiated On: 03/06/2022 8:54 AM Scope Withdrawal Time: 0 hours 11 minutes 7 seconds  Total Procedure Duration: 0 hours 13 minutes 55 seconds  Estimated Blood Loss:  Estimated blood loss: none.      [facility]

## 2022-03-06 NOTE — Transfer of Care (Signed)
Immediate Anesthesia Transfer of Care Note  Patient: Samuel Wiley  Procedure(s) Performed: COLONOSCOPY WITH PROPOFOL  Patient Location: Endoscopy Unit  Anesthesia Type:MAC  Level of Consciousness: awake, alert , and oriented  Airway & Oxygen Therapy: Patient Spontanous Breathing and Patient connected to face mask oxygen  Post-op Assessment: Report given to RN and Post -op Vital signs reviewed and stable  Post vital signs: Reviewed and stable  Last Vitals:  Vitals Value Taken Time  BP    Temp    Pulse    Resp    SpO2      Last Pain:  Vitals:   03/06/22 0806  TempSrc: Temporal  PainSc: 0-No pain         Complications: No notable events documented.

## 2022-03-06 NOTE — Anesthesia Postprocedure Evaluation (Signed)
Anesthesia Post Note  Patient: Shriyans L Santiesteban  Procedure(s) Performed: COLONOSCOPY WITH PROPOFOL  Patient location during evaluation: Endoscopy Anesthesia Type: General Level of consciousness: awake and alert Pain management: pain level controlled Vital Signs Assessment: post-procedure vital signs reviewed and stable Respiratory status: spontaneous breathing, nonlabored ventilation and respiratory function stable Cardiovascular status: blood pressure returned to baseline and stable Postop Assessment: no apparent nausea or vomiting Anesthetic complications: no   No notable events documented.   Last Vitals:  Vitals:   03/06/22 0939 03/06/22 0949  BP: 123/73 118/66  Pulse:    Resp:    Temp:    SpO2:      Last Pain:  Vitals:   03/06/22 0949  TempSrc:   PainSc: 0-No pain                 Alphonsus Sias

## 2022-03-06 NOTE — Anesthesia Preprocedure Evaluation (Addendum)
Anesthesia Evaluation  Patient identified by MRN, date of birth, ID band Patient awake  General Assessment Comment:No GLP x 2 weeks  Reviewed: Allergy & Precautions, NPO status , Patient's Chart, lab work & pertinent test results  Airway Mallampati: III  TM Distance: >3 FB Neck ROM: full    Dental  (+) Chipped   Pulmonary sleep apnea and Continuous Positive Airway Pressure Ventilation    Pulmonary exam normal        Cardiovascular hypertension, + Past MI  Normal cardiovascular exam  2014 ECHO Study Conclusions   - Left ventricle: The cavity size was normal. Wall thickness    was increased in a pattern of mild LVH. Systolic function    was normal. The estimated ejection fraction was in the    range of 55% to 60%. Features are consistent with a    pseudonormal left ventricular filling pattern, with    concomitant abnormal relaxation and increased filling    pressure (grade 2 diastolic dysfunction).  - Mitral valve: Mild regurgitation.     Neuro/Psych  PSYCHIATRIC DISORDERS Anxiety Depression    negative neurological ROS     GI/Hepatic Neg liver ROS,GERD  ,,  Endo/Other  negative endocrine ROS    Renal/GU negative Renal ROS  negative genitourinary   Musculoskeletal   Abdominal   Peds  Hematology negative hematology ROS (+)   Anesthesia Other Findings Past Medical History: No date: Anxiety and depression     Comment:  looses weight with episodes, advised to see psychiatrist              01/2014 No date: Asthma     Comment:  childhood No date: Chicken pox [id number]: Diastolic dysfunction     Comment:  Grade 2 diastolic dysfunction on echo 08/2012. No date: GERD (gastroesophageal reflux disease) No date: Heart palpitations No date: History of echocardiogram     Comment:  (5/09) showed EF 50-55%, mild LV dilation, mild LV               hypertrophy, pseudonormal  diastolic function, mild               mitral  regurgitation, and mild left atrial enlargement.;               Echo 8/14:  Mild LVH, EF 55-60%, Gr 2 DD, mild MR, mild               LAE No date: Hypertension     Comment:  high blood pressure readings  No date: Mild mitral regurgitation by prior echocardiogram No date: Obesity No date: Obstructive sleep apnea No date: Perioperative myocardial infarction     Comment:  Perioperative troponin elevation in 2007 after the               patient's uvuloplasty.  He did have some chest pain at               the time as well.  Left heart catheterization at that               time showed normal coronary arteries and normal renal                arteries, EF was 50%.  Past Surgical History: No date: CARDIAC CATHETERIZATION     Comment:  EF of 50% with Normal coronary arteries, normal renal               arteries 10/09/2014: MASS EXCISION; Left  Comment:  Procedure: EXCISION LEFT NECK LIPOMA;  Surgeon:               Rozetta Nunnery, MD;  Location: [place];  Service: ENT;  Laterality: Left;  BMI    Body Mass Index: 38.57 kg/m      Reproductive/Obstetrics negative OB ROS                             Anesthesia Physical Anesthesia Plan  ASA: 3  Anesthesia Plan: General   Post-op Pain Management:    Induction: Intravenous  PONV Risk Score and Plan: Propofol infusion and TIVA  Airway Management Planned: Natural Airway and Nasal Cannula  Additional Equipment:   Intra-op Plan:   Post-operative Plan:   Informed Consent: I have reviewed the patients History and Physical, chart, labs and discussed the procedure including the risks, benefits and alternatives for the proposed anesthesia with the patient or authorized representative who has indicated his/her understanding and acceptance.     Dental Advisory Given  Plan Discussed with: Anesthesiologist, CRNA and Surgeon  Anesthesia Plan Comments: (Patient consented for  risks of anesthesia including but not limited to:  - adverse reactions to medications - risk of airway placement if required - damage to eyes, teeth, lips or other oral mucosa - nerve damage due to positioning  - sore throat or hoarseness - Damage to heart, brain, nerves, lungs, other parts of body or loss of life  Patient voiced understanding.)       Anesthesia Quick Evaluation

## 2022-03-06 NOTE — H&P (Signed)
Jonathon Bellows, MD [address], [place], [place], [place], [address] [address], [place], [place], [place], [address] Phone: [phone]  Fax: [phone]  Primary Care Physician:  Michela Pitcher, NP   Pre-Procedure History & Physical: HPI:  Samuel Wiley is a 52 y.o. male is here for an colonoscopy.   Past Medical History:  Diagnosis Date   Anxiety and depression    looses weight with episodes, advised to see psychiatrist 01/2014   Asthma    childhood   Chicken pox    Diastolic dysfunction [id number]   Grade 2 diastolic dysfunction on echo 08/2012.   GERD (gastroesophageal reflux disease)    Heart palpitations    History of echocardiogram    (5/09) showed EF 50-55%, mild LV dilation, mild LV hypertrophy, pseudonormal  diastolic function, mild mitral regurgitation, and mild left atrial enlargement.;  Echo 8/14:  Mild LVH, EF 55-60%, Gr 2 DD, mild MR, mild LAE   Hypertension    high blood pressure readings    Mild mitral regurgitation by prior echocardiogram    Obesity    Obstructive sleep apnea    Perioperative myocardial infarction    Perioperative troponin elevation in 2007 after the patient's uvuloplasty.  He did have some chest pain at the time as well.  Left heart catheterization at that time showed normal coronary arteries and normal renal  arteries, EF was 50%.    Past Surgical History:  Procedure Laterality Date   CARDIAC CATHETERIZATION     EF of 50% with Normal coronary arteries, normal renal arteries   MASS EXCISION Left 10/09/2014   Procedure: EXCISION LEFT NECK LIPOMA;  Surgeon: Rozetta Nunnery, MD;  Location: [facility];  Service: ENT;  Laterality: Left;    Prior to Admission medications   Medication Sig Start Date End Date Taking? Authorizing Provider  arginine 500 MG tablet Take 1,000 mg by mouth 3 (three) times daily.    Yes [provider]  finasteride (PROSCAR) 5 MG tablet Take 5 mg by mouth daily. 06/29/21  Yes  [provider]  hydrochlorothiazide (HYDRODIURIL) 25 MG tablet Take 1 tablet (25 mg total) by mouth daily. 01/30/22  Yes Michela Pitcher, NP  nebivolol (BYSTOLIC) 5 MG tablet TAKE 1 TABLET BY MOUTH EVERY DAY 08/29/21  Yes Waunita Schooner, MD  oxybutynin (DITROPAN) 5 MG tablet TAKE 1 TABLET BY MOUTH EVERY 8 HOURS AS NEEDED FOR BLADDER SPASMS 02/09/18  Yes [provider]  Sildenafil Citrate (VIAGRA PO) Take by mouth as needed.   Yes [provider]  terazosin (HYTRIN) 10 MG capsule Take 1 capsule (10 mg total) by mouth in the morning and at bedtime. 02/16/20  Yes Waunita Schooner, MD  amLODipine (NORVASC) 10 MG tablet Take 1 tablet (10 mg total) by mouth daily. Patient not taking: Reported on 01/30/2022 08/26/21   Waunita Schooner, MD  aspirin 81 MG tablet Take 81 mg by mouth daily. Patient not taking: Reported on 01/30/2022    [provider]  tirzepatide (ZEPBOUND) 2.5 MG/0.5ML Pen Inject 2.5 mg into the skin once a week. Patient not taking: Reported on 02/04/2022 01/30/22   Michela Pitcher, NP    Allergies as of 02/04/2022 - Review Complete 01/30/2022  Allergen Reaction Noted   Atorvastatin calcium  07/08/2021   Lisinopril Cough 08/05/2021   Milk protein  01/19/2013   Clavulanic acid Diarrhea 10/09/2014    Family History  Problem Relation Age of Onset   Hypertension Father  family history of early onset   Diabetes Father    Hypertension Mother    Diabetes Mother    Stroke Brother 15   Hypertension Brother    Heart disease Maternal Grandfather    Heart disease Paternal Grandfather     Social History   Socioeconomic History   Marital status: Single    Spouse name: Not on file   Number of children: 0   Years of education: bachelors   Highest education level: Not on file  Occupational History   Not on file  Tobacco Use   Smoking status: Never   Smokeless tobacco: Never  Vaping Use   Vaping Use: Never used  Substance and Sexual Activity   Alcohol  use: Yes   Drug use: Never   Sexual activity: Not Currently  Other Topics Concern   Not on file  Social History Narrative   01/08/20   From: in [place], since 1990   Living: alone   Work: Chief Financial Officer at [employer]: mother passed away, family in the [place]   Has friend network in the area      Enjoys: gardening      Exercise: weight lifting - squats daily, more activity during gardening   Diet: beans, meat      Safety   Seat belts: Yes    Guns: Yes  and not currently secure   Safe in relationships: Yes             Social Determinants of Radio broadcast assistant Strain: Not on file  Food Insecurity: Not on file  Transportation Needs: Not on file  Physical Activity: Not on file  Stress: Not on file  Social Connections: Not on file  Intimate Partner Violence: Not on file    Review of Systems: See HPI, otherwise negative ROS  Physical Exam: BP (!) 166/98   Pulse 66   Temp (!) 96.3 F (35.7 C) (Temporal)   Resp 20   Ht '5\' 10"'$  (1.778 m)   Wt 121.9 kg   SpO2 100%   BMI 38.57 kg/m  General:   Alert,  pleasant and cooperative in NAD Head:  Normocephalic and atraumatic. Neck:  Supple; no masses or thyromegaly. Lungs:  Clear throughout to auscultation, normal respiratory effort.    Heart:  +S1, +S2, Regular rate and rhythm, No edema. Abdomen:  Soft, nontender and nondistended. Normal bowel sounds, without guarding, and without rebound.   Neurologic:  Alert and  oriented x4;  grossly normal neurologically.  Impression/Plan: Samuel Wiley is here for an colonoscopy to be performed for Screening colonoscopy average risk   Risks, benefits, limitations, and alternatives regarding  colonoscopy have been reviewed with the patient.  Questions have been answered.  All parties agreeable.   Jonathon Bellows, MD  03/06/2022, 8:56 AM

## 2022-03-09 ENCOUNTER — Encounter: Payer: Self-pay | Admitting: Gastroenterology

## 2022-03-09 LAB — SURGICAL PATHOLOGY

## 2022-03-16 ENCOUNTER — Encounter: Payer: Self-pay | Admitting: Nurse Practitioner

## 2022-03-21 ENCOUNTER — Other Ambulatory Visit: Payer: Self-pay | Admitting: Nurse Practitioner

## 2022-03-21 DIAGNOSIS — R7303 Prediabetes: Secondary | ICD-10-CM

## 2022-03-21 DIAGNOSIS — I10 Essential (primary) hypertension: Secondary | ICD-10-CM

## 2022-03-21 DIAGNOSIS — E785 Hyperlipidemia, unspecified: Secondary | ICD-10-CM

## 2022-03-21 DIAGNOSIS — G4730 Sleep apnea, unspecified: Secondary | ICD-10-CM

## 2022-03-24 ENCOUNTER — Other Ambulatory Visit: Payer: Self-pay | Admitting: Nurse Practitioner

## 2022-03-24 MED ORDER — ZEPBOUND 5 MG/0.5ML ~~LOC~~ SOAJ: 5.0000 mg | SUBCUTANEOUS | 0 refills | Status: DC

## 2022-03-26 ENCOUNTER — Encounter: Payer: Self-pay | Admitting: Nurse Practitioner

## 2022-03-26 ENCOUNTER — Ambulatory Visit: Payer: BC Managed Care – PPO | Admitting: Nurse Practitioner

## 2022-03-26 VITALS — BP 134/84 | HR 58 | Temp 98.1°F | Resp 16 | Ht 70.0 in | Wt 269.1 lb

## 2022-03-26 DIAGNOSIS — I10 Essential (primary) hypertension: Secondary | ICD-10-CM

## 2022-03-26 DIAGNOSIS — M79676 Pain in unspecified toe(s): Secondary | ICD-10-CM

## 2022-03-26 LAB — BASIC METABOLIC PANEL
BUN: 17 mg/dL (ref 6–23)
CO2: 29 mEq/L (ref 19–32)
Calcium: 9.3 mg/dL (ref 8.4–10.5)
Chloride: 99 mEq/L (ref 96–112)
Creatinine, Ser: 1.19 mg/dL (ref 0.40–1.50)
GFR: 69.87 mL/min (ref >60.00)
Glucose, Bld: 108 mg/dL — ABNORMAL HIGH (ref 70–99)
Potassium: 4 mEq/L (ref 3.5–5.1)
Sodium: 135 mEq/L (ref 135–145)

## 2022-03-26 LAB — URIC ACID: Uric Acid, Serum: 6.4 mg/dL (ref 4.0–7.8)

## 2022-03-26 NOTE — Progress Notes (Signed)
Established Patient Office Visit  Subjective   Patient ID: Samuel Wiley, male    DOB: [birth year]  Age: 52 y.o. MRN: [id number]  Chief Complaint  Patient presents with   Medication Refill    Medication Refill Pertinent negatives include no chest pain, chills, fever or headaches.    HTN: Patient has been off of his blood pressure medication for several weeks in the juncture.  We did restart patient on HCTZ he was post to follow-up in 1 month for blood pressure recheck along with BMP but here today for follow-up. States that he does not check BP at home his machine is not working appropriately When he get up he is in the 130s and later int he day 150s.  Patient currently maintained on hydrochlorothiazide, terazosin, nebivolol.  Patient states he is taking medication as prescribed states he will take the nebivolol with HCTZ in the morning and take both terazosin capsules at night.  Obesity: at last office visit patient was started on zepbound and is here for a recheck.  Patient states he has not noticed a great difference he has been off the medication approximately 2 weeks as he did not know he had a refill at the pharmacy.  Patient states that he did notice it did help with portion size and he experienced no adverse drug events per his report.  Toe pain: Patient states that after eating seafood the next day you have pain in the knuckle of the great toe.  States is not currently happening it has been a while since he had any seafood.  He does especially notice it was throughout.    Review of Systems  Constitutional:  Negative for chills and fever.  Respiratory:  Negative for shortness of breath.   Cardiovascular:  Negative for chest pain.  Neurological:  Negative for dizziness and headaches.      Objective:     BP 134/84   Pulse (!) 58   Temp 98.1 F (36.7 C)   Resp 16   Ht '5\' 10"'$  (1.778 m)   Wt 269 lb 2 oz (122.1 kg)   SpO2 98%   BMI 38.62 kg/m  BP Readings from Last 3  Encounters:  03/26/22 134/84  03/06/22 118/66  01/30/22 (!) 162/98   Wt Readings from Last 3 Encounters:  03/26/22 269 lb 2 oz (122.1 kg)  03/06/22 268 lb 12.8 oz (121.9 kg)  01/30/22 259 lb (117.5 kg)      Physical Exam Vitals and nursing note reviewed.  Constitutional:      Appearance: Normal appearance.  Cardiovascular:     Rate and Rhythm: Normal rate and regular rhythm.     Heart sounds: Normal heart sounds.  Pulmonary:     Effort: Pulmonary effort is normal.     Breath sounds: Normal breath sounds.  Abdominal:     General: Bowel sounds are normal. There is no distension.     Palpations: There is no mass.     Tenderness: There is no abdominal tenderness.     Hernia: No hernia is present.  Neurological:     Mental Status: He is alert.      No results found for any visits on 03/26/22.    The 10-year ASCVD risk score (Arnett DK, et al., 2019) is: 9.9%    Assessment & Plan:   Problem List Items Addressed This Visit       Cardiovascular and Mediastinum   Essential hypertension - Primary    Patient currently  maintained on hydrochlorothiazide 25 mg, nebivolol 5, terazosin 10 mg.  Patient's blood pressure within normal limits.  He does have a cuff at home but states he does not always work appropriately.  Did mention blood pressure seems to get higher as he is in today the patient is taking both of the terazosin's at once at night courage patient to split.      Relevant Orders   Basic metabolic panel     Other   Morbid obesity ([place])    Patient started Zepbound and tolerating medication well.  He has gained weight but has been off of medication for 2 weeks.  Will restart at 5 mg patient to message me 1 week before he needs his next dose will titrate up.      Relevant Orders   Basic metabolic panel   Pain of great toe    Not currently happening.  States he feels it more after eating seafood.  Given he is on a diuretic and having after seafood likely gout check  uric acid today.      Relevant Orders   Uric acid    Return in about 3 months (around 06/26/2022) for Zepbound/wegith recheck .    Romilda Garret, NP

## 2022-03-26 NOTE — Assessment & Plan Note (Signed)
Patient started Zepbound and tolerating medication well.  He has gained weight but has been off of medication for 2 weeks.  Will restart at 5 mg patient to message me 1 week before he needs his next dose will titrate up.

## 2022-03-26 NOTE — Patient Instructions (Signed)
Nice to see you today The '5mg'$  dose of the zepbound may make you nausea in the beginning.  When you are getting ready to take the last injection for the month mychart me and we will go up to the next dose Follow up with me in 3 months, sooner if you need me

## 2022-03-26 NOTE — Assessment & Plan Note (Signed)
Not currently happening.  States he feels it more after eating seafood.  Given he is on a diuretic and having after seafood likely gout check uric acid today.

## 2022-03-26 NOTE — Assessment & Plan Note (Addendum)
Patient currently maintained on hydrochlorothiazide 25 mg, nebivolol 5, terazosin 10 mg.  Patient's blood pressure within normal limits.  He does have a cuff at home but states he does not always work appropriately.  Did mention blood pressure seems to get higher as he is in today the patient is taking both of the terazosin's at once at night courage patient to split.

## 2022-04-06 ENCOUNTER — Other Ambulatory Visit: Payer: Self-pay | Admitting: Nurse Practitioner

## 2022-04-06 DIAGNOSIS — I10 Essential (primary) hypertension: Secondary | ICD-10-CM

## 2022-04-23 ENCOUNTER — Other Ambulatory Visit: Payer: Self-pay | Admitting: Nurse Practitioner

## 2022-04-23 MED ORDER — ZEPBOUND 7.5 MG/0.5ML ~~LOC~~ SOAJ: 7.5000 mg | SUBCUTANEOUS | 0 refills | Status: DC

## 2022-05-15 ENCOUNTER — Telehealth: Payer: BC Managed Care – PPO | Admitting: Family Medicine

## 2022-05-15 ENCOUNTER — Other Ambulatory Visit: Payer: Self-pay | Admitting: Nurse Practitioner

## 2022-05-15 DIAGNOSIS — M109 Gout, unspecified: Secondary | ICD-10-CM | POA: Diagnosis not present

## 2022-05-15 MED ORDER — PREDNISONE 20 MG PO TABS: 20.0000 mg | ORAL_TABLET | Freq: Two times a day (BID) | ORAL | 0 refills | Status: AC

## 2022-05-15 MED ORDER — ZEPBOUND 10 MG/0.5ML ~~LOC~~ SOAJ: 10.0000 mg | SUBCUTANEOUS | 0 refills | Status: DC

## 2022-05-15 NOTE — Patient Instructions (Signed)
Gout  Gout is a condition that causes painful swelling of the joints. Gout is a type of inflammation of the joints (arthritis). This condition is caused by having too much uric acid in the body. Uric acid is a chemical that forms when the body breaks down substances called purines. Purines are important for building body proteins. When the body has too much uric acid, sharp crystals can form and build up inside the joints. This causes pain and swelling. Gout attacks can happen quickly and may be very painful (acute gout). Over time, the attacks can affect more joints and become more frequent (chronic gout). Gout can also cause uric acid to build up under the skin and inside the kidneys. What are the causes? This condition is caused by too much uric acid in your blood. This can happen because: Your kidneys do not remove enough uric acid from your blood. This is the most common cause. Your body makes too much uric acid. This can happen with some cancers and cancer treatments. It can also occur if your body is breaking down too many red blood cells (hemolytic anemia). You eat too many foods that are high in purines. These foods include organ meats and some seafood. Alcohol, especially beer, is also high in purines. A gout attack may be triggered by trauma or stress. What increases the risk? The following factors may make you more likely to develop this condition: Having a family history of gout. Being male and middle-aged. Being male and having gone through menopause. Taking certain medicines, including aspirin, cyclosporine, diuretics, levodopa, and niacin. Having an organ transplant. Having certain conditions, such as: Being obese. Lead poisoning. Kidney disease. A skin condition called psoriasis. Other factors include: Losing weight too quickly. Being dehydrated. Frequently drinking alcohol, especially beer. Frequently drinking beverages that are sweetened with a type of sugar called  fructose. What are the signs or symptoms? An attack of acute gout happens quickly. It usually occurs in just one joint. The most common place is the big toe. Attacks often start at night. Other joints that may be affected include joints of the feet, ankle, knee, fingers, wrist, or elbow. Symptoms of this condition may include: Severe pain. Warmth. Swelling. Stiffness. Tenderness. The affected joint may be very painful to touch. Shiny, red, or purple skin. Chills and fever. Chronic gout may cause symptoms more frequently. More joints may be involved. You may also have white or yellow lumps (tophi) on your hands or feet or in other areas near your joints. How is this diagnosed? This condition is diagnosed based on your symptoms, your medical history, and a physical exam. You may have tests, such as: Blood tests to measure uric acid levels. Removal of joint fluid with a thin needle (aspiration) to look for uric acid crystals. X-rays to look for joint damage. How is this treated? Treatment for this condition has two phases: treating an acute attack and preventing future attacks. Acute gout treatment may include medicines to reduce pain and swelling, including: NSAIDs, such as ibuprofen. Steroids. These are strong anti-inflammatory medicines that can be taken by mouth (orally) or injected into a joint. Colchicine. This medicine relieves pain and swelling when it is taken soon after an attack. It can be given by mouth or through an IV. Preventive treatment may include: Daily use of smaller doses of NSAIDs or colchicine. Use of a medicine that reduces uric acid levels in your blood, such as allopurinol. Changes to your diet. You may need to see   a dietitian about what to eat and drink to prevent gout. Follow these instructions at home: During a gout attack  If directed, put ice on the affected area. To do this: Put ice in a plastic bag. Place a towel between your skin and the bag. Leave the  ice on for 20 minutes, 2-3 times a day. Remove the ice if your skin turns bright red. This is very important. If you cannot feel pain, heat, or cold, you have a greater risk of damage to the area. Raise (elevate) the affected joint above the level of your heart as often as possible. Rest the joint as much as possible. If the affected joint is in your leg, you may be given crutches to use. Follow instructions from your health care provider about eating or drinking restrictions. Avoiding future gout attacks Follow a low-purine diet as told by your dietitian or health care provider. Avoid foods and drinks that are high in purines, including liver, kidney, anchovies, asparagus, herring, mushrooms, mussels, and beer. Maintain a healthy weight or lose weight if you are overweight. If you want to lose weight, talk with your health care provider. Do not lose weight too quickly. Start or maintain an exercise program as told by your health care provider. Eating and drinking Avoid drinking beverages that contain fructose. Drink enough fluids to keep your urine pale yellow. If you drink alcohol: Limit how much you have to: 0-1 drink a day for women who are not pregnant. 0-2 drinks a day for men. Know how much alcohol is in a drink. In the [place] one drink equals one 12 oz bottle of beer (355 mL), one 5 oz glass of wine (148 mL), or one 1 oz glass of hard liquor (44 mL). General instructions Take over-the-counter and prescription medicines only as told by your health care provider. Ask your health care provider if the medicine prescribed to you requires you to avoid driving or using machinery. Return to your normal activities as told by your health care provider. Ask your health care provider what activities are safe for you. Keep all follow-up visits. This is important. Where to find more information National Institutes of Health: www.niams.nih.gov Contact a health care provider if you have: Another  gout attack. Continuing symptoms of a gout attack after 10 days of treatment. Side effects from your medicines. Chills or a fever. Burning pain when you urinate. Pain in your lower back or abdomen. Get help right away if you: Have severe or uncontrolled pain. Cannot urinate. Summary Gout is painful swelling of the joints caused by having too much uric acid in the body. The most common site for gout to occur is in the big toe, but it can affect other joints in the body. Medicines and dietary changes can help to prevent and treat gout attacks. This information is not intended to replace advice given to you by your health care provider. Make sure you discuss any questions you have with your health care provider. Document Revised: 10/02/2020 Document Reviewed: 10/02/2020 Elsevier Patient Education  2023 Elsevier Inc.  

## 2022-05-15 NOTE — Progress Notes (Signed)
Virtual Visit Consent   Samuel Wiley, you are scheduled for a virtual visit with a  provider today. Just as with appointments in the office, your consent must be obtained to participate. Your consent will be active for this visit and any virtual visit you may have with one of our providers in the next 365 days. If you have a MyChart account, a copy of this consent can be sent to you electronically.  As this is a virtual visit, video technology does not allow for your provider to perform a traditional examination. This may limit your provider's ability to fully assess your condition. If your provider identifies any concerns that need to be evaluated in person or the need to arrange testing (such as labs, EKG, etc.), we will make arrangements to do so. Although advances in technology are sophisticated, we cannot ensure that it will always work on either your end or our end. If the connection with a video visit is poor, the visit may have to be switched to a telephone visit. With either a video or telephone visit, we are not always able to ensure that we have a secure connection.  By engaging in this virtual visit, you consent to the provision of healthcare and authorize for your insurance to be billed (if applicable) for the services provided during this visit. Depending on your insurance coverage, you may receive a charge related to this service.  I need to obtain your verbal consent now. Are you willing to proceed with your visit today? Samuel Wiley has provided verbal consent on 05/15/2022 for a virtual visit (video or telephone). Georgana Curio, FNP  Date: 05/15/2022 9:34 AM  Virtual Visit via Video Note   I, Georgana Curio, connected with  Samuel Wiley  ([id number], 1970-05-14) on 05/15/22 at  9:30 AM EDT by a video-enabled telemedicine application and verified that I am speaking with the correct person using two identifiers.  Location: Patient: Virtual Visit Location Patient:  Home Provider: Virtual Visit Location Provider: Home Office   I discussed the limitations of evaluation and management by telemedicine and the availability of in person appointments. The patient expressed understanding and agreed to proceed.    History of Present Illness: Samuel Wiley is a 52 y.o. who identifies as a male who was assigned male at birth, and is being seen today for gout left great toe. He says he has taken prednisone in the past with success. He says he can hardly walk on it. Marland Kitchen  HPI: HPI  Problems:  Patient Active Problem List   Diagnosis Date Noted   Pain of great toe 03/26/2022   Encounter for screening colonoscopy 03/06/2022   Adenomatous polyp of colon 03/06/2022   Hyperlipidemia 07/08/2021   BPH (benign prostatic hyperplasia) 01/08/2020   Asthma 01/08/2020   Prediabetes 01/08/2020   Right foot pain 01/08/2020   Ganglion cyst of finger of right hand 03/30/2016   Diastolic dysfunction 11/30/2015   Erectile dysfunction 03/26/2011   Morbid obesity ([facility]) 03/26/2010   Essential hypertension 03/26/2010   PREMATURE ATRIAL CONTRACTIONS 03/26/2010   Sleep apnea 03/26/2010   PALPITATIONS 03/26/2010   Chest pain 03/26/2010    Allergies:  Allergies  Allergen Reactions   Atorvastatin Calcium     Cognitive issues   Lisinopril Cough   Milk Protein    Clavulanic Acid Diarrhea   Medications:  Current Outpatient Medications:    arginine 500 MG tablet, Take 1,000 mg by mouth 3 (three) times daily. , Disp: ,  Rfl:    aspirin 81 MG tablet, Take 81 mg by mouth daily. (Patient not taking: Reported on 01/30/2022), Disp: , Rfl:    finasteride (PROSCAR) 5 MG tablet, Take 5 mg by mouth daily., Disp: , Rfl:    hydrochlorothiazide (HYDRODIURIL) 25 MG tablet, TAKE 1 TABLET (25 MG TOTAL) BY MOUTH DAILY., Disp: 90 tablet, Rfl: 2   nebivolol (BYSTOLIC) 5 MG tablet, TAKE 1 TABLET BY MOUTH EVERY DAY, Disp: 90 tablet, Rfl: 3   oxybutynin (DITROPAN) 5 MG tablet, TAKE 1 TABLET BY MOUTH  EVERY 8 HOURS AS NEEDED FOR BLADDER SPASMS, Disp: , Rfl:    Sildenafil Citrate (VIAGRA PO), Take by mouth as needed., Disp: , Rfl:    terazosin (HYTRIN) 10 MG capsule, Take 1 capsule (10 mg total) by mouth in the morning and at bedtime., Disp: 60 capsule, Rfl: 0   tirzepatide (ZEPBOUND) 7.5 MG/0.5ML Pen, Inject 7.5 mg into the skin once a week., Disp: 2 mL, Rfl: 0  Observations/Objective: Patient is well-developed, well-nourished in no acute distress.  Resting comfortably  at home.  Head is normocephalic, atraumatic.  No labored breathing.  Speech is clear and coherent with logical content.  Patient is alert and oriented at baseline.    Assessment and Plan: 1. Acute gout involving toe of right foot, unspecified cause  Follow up with pcp.   Follow Up Instructions: I discussed the assessment and treatment plan with the patient. The patient was provided an opportunity to ask questions and all were answered. The patient agreed with the plan and demonstrated an understanding of the instructions.  A copy of instructions were sent to the patient via MyChart unless otherwise noted below.     The patient was advised to call back or seek an in-person evaluation if the symptoms worsen or if the condition fails to improve as anticipated.  Time:  I spent 10 minutes with the patient via telehealth technology discussing the above problems/concerns.    Georgana Curio, FNP

## 2022-09-22 ENCOUNTER — Telehealth: Payer: Self-pay

## 2022-09-22 MED ORDER — NEBIVOLOL HCL 5 MG PO TABS: 5.0000 mg | ORAL_TABLET | Freq: Every day | ORAL | 1 refills | Status: DC

## 2022-09-22 NOTE — Telephone Encounter (Signed)
Does pt need to be scheduled for office visit?   LAST APPOINTMENT DATE: 03/26/2022   NEXT APPOINTMENT DATE: Visit date not found   Nebivolol 5mg    LAST REFILL: 08/29/2021  QTY: #90 [id number]

## 2022-09-22 NOTE — Addendum Note (Signed)
Addended by: Eden Emms on: 09/22/2022 01:39 PM   Modules accepted: Orders

## 2022-10-13 ENCOUNTER — Other Ambulatory Visit: Payer: Self-pay | Admitting: Nurse Practitioner

## 2022-10-14 ENCOUNTER — Encounter: Payer: Self-pay | Admitting: Nurse Practitioner

## 2022-10-16 MED ORDER — ZEPBOUND 2.5 MG/0.5ML ~~LOC~~ SOAJ: 2.5000 mg | SUBCUTANEOUS | 0 refills | Status: DC

## 2022-10-19 ENCOUNTER — Telehealth: Payer: Self-pay

## 2022-10-19 ENCOUNTER — Other Ambulatory Visit (HOSPITAL_COMMUNITY): Payer: Self-pay

## 2022-10-19 NOTE — Telephone Encounter (Signed)
Pharmacy Patient Advocate Encounter   Received notification from Physician's Office that prior authorization for Zepbound is required/requested.   Insurance verification completed.   The patient is insured through [employer] .   Per test claim: PA required; PA submitted to [employer] via Direct customer service line verbally Key/confirmation #/[id number]  Approved from 9.8.24 to 11/19/22   A qty limit was also completed and Approved as the patient is starting back over on the lowest dosage.  [id number]  Approved from 9.8.24 to 11/19/22

## 2022-10-20 ENCOUNTER — Other Ambulatory Visit (HOSPITAL_COMMUNITY): Payer: Self-pay

## 2022-10-21 MED ORDER — ZEPBOUND 2.5 MG/0.5ML ~~LOC~~ SOAJ
2.5000 mg | SUBCUTANEOUS | 0 refills | Status: DC
Start: 2022-10-21 — End: 2022-11-19

## 2022-11-17 ENCOUNTER — Other Ambulatory Visit: Payer: Self-pay | Admitting: Nurse Practitioner

## 2022-11-17 ENCOUNTER — Encounter: Payer: Self-pay | Admitting: Nurse Practitioner

## 2022-11-19 MED ORDER — TIRZEPATIDE 5 MG/0.5ML ~~LOC~~ SOAJ: 5.0000 mg | SUBCUTANEOUS | 0 refills | Status: DC

## 2022-11-20 ENCOUNTER — Other Ambulatory Visit: Payer: Self-pay | Admitting: Nurse Practitioner

## 2022-11-20 ENCOUNTER — Encounter: Payer: Self-pay | Admitting: Nurse Practitioner

## 2022-11-24 ENCOUNTER — Other Ambulatory Visit: Payer: Self-pay | Admitting: Nurse Practitioner

## 2022-11-24 MED ORDER — TIRZEPATIDE-WEIGHT MANAGEMENT 5 MG/0.5ML ~~LOC~~ SOLN: 5.0000 mg | SUBCUTANEOUS | 0 refills | Status: DC

## 2022-11-26 ENCOUNTER — Ambulatory Visit: Payer: BC Managed Care – PPO | Admitting: Nurse Practitioner

## 2022-11-26 ENCOUNTER — Encounter: Payer: Self-pay | Admitting: Nurse Practitioner

## 2022-11-26 ENCOUNTER — Other Ambulatory Visit (HOSPITAL_COMMUNITY): Payer: Self-pay

## 2022-11-26 VITALS — BP 124/84 | HR 56 | Temp 98.2°F | Ht 70.0 in | Wt 272.0 lb

## 2022-11-26 DIAGNOSIS — M79671 Pain in right foot: Secondary | ICD-10-CM | POA: Diagnosis not present

## 2022-11-26 DIAGNOSIS — Z6839 Body mass index (BMI) 39.0-39.9, adult: Secondary | ICD-10-CM

## 2022-11-26 DIAGNOSIS — D229 Melanocytic nevi, unspecified: Secondary | ICD-10-CM

## 2022-11-26 MED ORDER — ZEPBOUND 5 MG/0.5ML ~~LOC~~ SOAJ: 5.0000 mg | SUBCUTANEOUS | 0 refills | Status: DC

## 2022-11-26 NOTE — Assessment & Plan Note (Signed)
Benign skin mole.  Signs and symptoms reviewed when to seek care offered to send patient to dermatology politely declined.  Do not think he needs to see dermatology at this juncture

## 2022-11-26 NOTE — Progress Notes (Signed)
Acute Office Visit  Subjective:     Patient ID: Samuel Wiley, male    DOB: 09-Dec-1970, 52 y.o.   MRN: [id number]  Chief Complaint  Patient presents with   Acute Visit    Bump on head next to left ear x3-4 weeks    HPI Patient is in today for skin lesion  with a history of HTN, PAC, OSA, HLD, prediabetes    States that he was shaving his head and noticed it. States for about a month. State that it does not itch bleed or have discharge. Does not think it has changed in size. No OTC treatment. Does not have a dermatologist. No family history or personal history of skin cancers   Review of Systems  Constitutional:  Negative for chills and fever.  Respiratory:  Negative for shortness of breath.   Cardiovascular:  Negative for chest pain.  Neurological:  Negative for headaches.        Objective:    BP 124/84   Pulse (!) 56   Temp 98.2 F (36.8 C)   Ht 5\' 10"  (1.778 m)   Wt 272 lb (123.4 kg)   SpO2 99%   BMI 39.03 kg/m    Physical Exam Vitals and nursing note reviewed.  Constitutional:      Appearance: Normal appearance.  HENT:     Head:   Cardiovascular:     Rate and Rhythm: Normal rate and regular rhythm.     Heart sounds: Normal heart sounds.  Pulmonary:     Effort: Pulmonary effort is normal.     Breath sounds: Normal breath sounds.  Neurological:     Mental Status: He is alert.     No results found for any visits on 11/26/22.      Assessment & Plan:   Problem List Items Addressed This Visit       Musculoskeletal and Integument   Benign skin mole    Benign skin mole.  Signs and symptoms reviewed when to seek care offered to send patient to dermatology politely declined.  Do not think he needs to see dermatology at this juncture        Other   Morbid obesity [facility])    Patient was having trouble getting Zepbound from mail order pharmacy.  Will resend prescription today to break in order pharmacy start with 5 mg once weekly as he is already  done 2.5 follow-up in 3 months      Relevant Medications   tirzepatide (ZEPBOUND) 5 MG/0.5ML Pen   Right foot pain - Primary    History of same.  Felt to be gout in the past.  Intermittent in nature.  Uric acid have been normal see previous office note from me.  Ambulatory referral to podiatry today      Relevant Orders   Ambulatory referral to Podiatry    Meds ordered this encounter  Medications   DISCONTD: tirzepatide (ZEPBOUND) 5 MG/0.5ML Pen    Sig: Inject 5 mg into the skin once a week.    Dispense:  2 mL    Refill:  0    Order Specific Question:   Supervising Provider    Answer:   TOWER, MARNE A [[id number]]   tirzepatide (ZEPBOUND) 5 MG/0.5ML Pen    Sig: Inject 5 mg into the skin once a week.    Dispense:  2 mL    Refill:  0    Order Specific Question:   Supervising Provider    Answer:  TOWER, MARNE A [[id number]]    Return in about 3 months (around 02/26/2023) for zepbound/weight recheck .  Audria Nine, NP

## 2022-11-26 NOTE — Assessment & Plan Note (Signed)
History of same.  Felt to be gout in the past.  Intermittent in nature.  Uric acid have been normal see previous office note from me.  Ambulatory referral to podiatry today

## 2022-11-26 NOTE — Assessment & Plan Note (Signed)
Patient was having trouble getting Zepbound from mail order pharmacy.  Will resend prescription today to break in order pharmacy start with 5 mg once weekly as he is already done 2.5 follow-up in 3 months

## 2022-11-26 NOTE — Patient Instructions (Signed)
Nice to see you today I have referred you to podiatry Follow up with me once you have been on the zepbound for 3 months

## 2022-11-26 NOTE — Telephone Encounter (Signed)
Per test claim, new PA is active until 06/17/2023, medication has been filled via mail order for pt today and will be eligible for refill 12/16/2022

## 2022-12-31 ENCOUNTER — Other Ambulatory Visit: Payer: Self-pay | Admitting: Nurse Practitioner

## 2022-12-31 ENCOUNTER — Encounter: Payer: Self-pay | Admitting: Nurse Practitioner

## 2022-12-31 MED ORDER — ZEPBOUND 7.5 MG/0.5ML ~~LOC~~ SOAJ: 7.5000 mg | SUBCUTANEOUS | 0 refills | Status: DC

## 2023-01-26 ENCOUNTER — Encounter: Payer: Self-pay | Admitting: Nurse Practitioner

## 2023-01-26 ENCOUNTER — Other Ambulatory Visit: Payer: Self-pay | Admitting: Nurse Practitioner

## 2023-01-26 MED ORDER — ZEPBOUND 10 MG/0.5ML ~~LOC~~ SOAJ: 10.0000 mg | SUBCUTANEOUS | 0 refills | Status: DC

## 2023-01-27 ENCOUNTER — Other Ambulatory Visit: Payer: Self-pay | Admitting: Nurse Practitioner

## 2023-01-27 DIAGNOSIS — I10 Essential (primary) hypertension: Secondary | ICD-10-CM

## 2023-02-25 ENCOUNTER — Other Ambulatory Visit: Payer: Self-pay | Admitting: Nurse Practitioner

## 2023-02-26 ENCOUNTER — Ambulatory Visit: Payer: BC Managed Care – PPO | Admitting: Nurse Practitioner

## 2023-02-26 VITALS — BP 122/70 | HR 60 | Temp 97.9°F | Ht 70.0 in | Wt 252.4 lb

## 2023-02-26 DIAGNOSIS — I10 Essential (primary) hypertension: Secondary | ICD-10-CM

## 2023-02-26 MED ORDER — ZEPBOUND 12.5 MG/0.5ML ~~LOC~~ SOAJ: 12.5000 mg | SUBCUTANEOUS | 0 refills | Status: DC

## 2023-02-26 NOTE — Progress Notes (Signed)
   Established Patient Office Visit  Subjective   Patient ID: Samuel Wiley, male    DOB: [birth year]  Age: 53 y.o. MRN: [id number]  Chief Complaint  Patient presents with   Weight Check    Follow up on Zepbound. Pts weight last OV was 272. Pt weight today is 252.4. Pt states he is maintaining well on medication.     HPI   Obesity: patient is currently maintainded on zepbound 10 and is here for a follow up. No side effects from the medicaiotn. BM every day to twice a day Somedays he will do breakfast and dineer or lunch and dinner. No snacks. He will dirnk water and over the past week he has had some soda  States that he has been slack on the exercise as of lately. He has been messing with his plants/gardening In the house     Review of Systems  Constitutional:  Negative for chills and fever.  Respiratory:  Negative for shortness of breath.   Cardiovascular:  Negative for chest pain.  Gastrointestinal:  Negative for abdominal pain, constipation, diarrhea, nausea and vomiting.      Objective:     BP 122/70   Pulse 60   Temp 97.9 F (36.6 C) (Oral)   Ht 5\' 10"  (1.778 m)   Wt 252 lb 6.4 oz (114.5 kg)   SpO2 98%   BMI 36.22 kg/m  BP Readings from Last 3 Encounters:  02/26/23 122/70  11/26/22 124/84  03/26/22 134/84   Wt Readings from Last 3 Encounters:  02/26/23 252 lb 6.4 oz (114.5 kg)  11/26/22 272 lb (123.4 kg)  03/26/22 269 lb 2 oz (122.1 kg)   SpO2 Readings from Last 3 Encounters:  02/26/23 98%  11/26/22 99%  03/26/22 98%      Physical Exam Vitals and nursing note reviewed.  Constitutional:      Appearance: Normal appearance.  Cardiovascular:     Rate and Rhythm: Normal rate and regular rhythm.     Heart sounds: Normal heart sounds.  Pulmonary:     Effort: Pulmonary effort is normal.     Breath sounds: Normal breath sounds.  Abdominal:     General: Bowel sounds are normal.  Neurological:     Mental Status: He is alert.      No results found  for any visits on 02/26/23.    The 10-year ASCVD risk score (Arnett DK, et al., 2019) is: 8.8%    Assessment & Plan:   Problem List Items Addressed This Visit       Cardiovascular and Mediastinum   Essential hypertension - Primary     Other   Morbid obesity (HCC)   Patient's BMI is trending down.  He has lost 20 pounds since last office it.  Continue working modifications.  continue zepbound 12.5 mg weekly for 4 weeks.  if he tolerates medication well anticipate to titrate up to max dose of 15 mg weekly after that.      Relevant Medications   tirzepatide (ZEPBOUND) 12.5 MG/0.5ML Pen    Return in about 3 months (around 05/26/2023) for weight/zepbound.    Audria Nine, NP

## 2023-02-26 NOTE — Assessment & Plan Note (Signed)
Patient's BMI is trending down.  He has lost 20 pounds since last office it.  Continue working modifications.  continue zepbound 12.5 mg weekly for 4 weeks.  if he tolerates medication well anticipate to titrate up to max dose of 15 mg weekly after that.

## 2023-02-26 NOTE — Patient Instructions (Signed)
Nice to see you today I have increased the zepbound to 12.5mg  a week Follow up with me in 3 months, sooner if you need me

## 2023-03-24 ENCOUNTER — Other Ambulatory Visit: Payer: Self-pay | Admitting: Nurse Practitioner

## 2023-03-25 ENCOUNTER — Encounter: Payer: Self-pay | Admitting: Nurse Practitioner

## 2023-03-26 MED ORDER — ZEPBOUND 15 MG/0.5ML ~~LOC~~ SOAJ: 15.0000 mg | SUBCUTANEOUS | 0 refills | Status: DC

## 2023-03-27 ENCOUNTER — Other Ambulatory Visit: Payer: Self-pay | Admitting: Nurse Practitioner

## 2023-04-05 MED ORDER — ZEPBOUND 15 MG/0.5ML ~~LOC~~ SOAJ: 15.0000 mg | SUBCUTANEOUS | 0 refills | Status: DC

## 2023-04-05 NOTE — Addendum Note (Signed)
 Addended by: Eden Emms on: 04/05/2023 01:37 PM   Modules accepted: Orders

## 2023-05-04 MED ORDER — ZEPBOUND 15 MG/0.5ML ~~LOC~~ SOAJ: 15.0000 mg | SUBCUTANEOUS | 0 refills | Status: DC

## 2023-05-04 NOTE — Addendum Note (Signed)
 Addended by: Dorothe Gaster on: 05/04/2023 10:29 AM   Modules accepted: Orders

## 2023-05-20 ENCOUNTER — Encounter (HOSPITAL_COMMUNITY): Payer: Self-pay

## 2023-05-27 ENCOUNTER — Ambulatory Visit: Payer: BC Managed Care – PPO | Admitting: Nurse Practitioner

## 2023-05-27 VITALS — BP 120/70 | HR 70 | Temp 98.0°F | Ht 70.0 in | Wt 236.6 lb

## 2023-05-27 DIAGNOSIS — Z125 Encounter for screening for malignant neoplasm of prostate: Secondary | ICD-10-CM | POA: Diagnosis not present

## 2023-05-27 DIAGNOSIS — E785 Hyperlipidemia, unspecified: Secondary | ICD-10-CM

## 2023-05-27 DIAGNOSIS — E669 Obesity, unspecified: Secondary | ICD-10-CM | POA: Insufficient documentation

## 2023-05-27 DIAGNOSIS — I10 Essential (primary) hypertension: Secondary | ICD-10-CM | POA: Diagnosis not present

## 2023-05-27 DIAGNOSIS — R7303 Prediabetes: Secondary | ICD-10-CM

## 2023-05-27 LAB — COMPREHENSIVE METABOLIC PANEL WITH GFR
ALT: 15 U/L (ref 0–53)
AST: 14 U/L (ref 0–37)
Albumin: 4.1 g/dL (ref 3.5–5.2)
Alkaline Phosphatase: 64 U/L (ref 39–117)
BUN: 21 mg/dL (ref 6–23)
CO2: 31 meq/L (ref 19–32)
Calcium: 9.2 mg/dL (ref 8.4–10.5)
Chloride: 96 meq/L (ref 96–112)
Creatinine, Ser: 1.12 mg/dL (ref 0.40–1.50)
GFR: 74.53 mL/min (ref >60.00)
Glucose, Bld: 94 mg/dL (ref 70–99)
Potassium: 3.9 meq/L (ref 3.5–5.1)
Sodium: 134 meq/L — ABNORMAL LOW (ref 135–145)
Total Bilirubin: 0.8 mg/dL (ref 0.2–1.2)
Total Protein: 7.1 g/dL (ref 6.0–8.3)

## 2023-05-27 LAB — LIPID PANEL
Cholesterol: 153 mg/dL (ref 0–200)
HDL: 44.6 mg/dL (ref >39.00)
LDL Cholesterol: 102 mg/dL — ABNORMAL HIGH (ref 0–99)
NonHDL: 108.5
Total CHOL/HDL Ratio: 3
Triglycerides: 33 mg/dL (ref 0.0–149.0)
VLDL: 6.6 mg/dL (ref 0.0–40.0)

## 2023-05-27 LAB — HEMOGLOBIN A1C: Hgb A1c MFr Bld: 5.7 % (ref 4.6–6.5)

## 2023-05-27 LAB — CBC
HCT: 42.1 % (ref 39.0–52.0)
Hemoglobin: 13.9 g/dL (ref 13.0–17.0)
MCHC: 32.9 g/dL (ref 30.0–36.0)
MCV: 81.7 fl (ref 78.0–100.0)
Platelets: 199 10*3/uL (ref 150.0–400.0)
RBC: 5.15 Mil/uL (ref 4.22–5.81)
RDW: 13.7 % (ref 11.5–15.5)
WBC: 2.7 10*3/uL — ABNORMAL LOW (ref 4.0–10.5)

## 2023-05-27 LAB — TSH: TSH: 0.51 u[IU]/mL (ref 0.35–5.50)

## 2023-05-27 LAB — PSA: PSA: 0.74 ng/mL (ref 0.10–4.00)

## 2023-05-27 NOTE — Patient Instructions (Signed)
 Nice to see you today I will be in touch with the labs once I have them Follow up with me in 3 months for your physical

## 2023-05-27 NOTE — Assessment & Plan Note (Signed)
 Patient currently maintained on Zepbound  50 mg weekly.  He is tolerating medication well with side effect of nausea when he overeats or eats poorly.  Patient has had great weight reduction of approximately 40 pounds in 6 months.  Continue medication as prescribed continue working on healthy lifestyle modifications.

## 2023-05-27 NOTE — Progress Notes (Signed)
 Established Patient Office Visit  Subjective   Patient ID: Samuel Wiley, male    DOB: 24-Feb-1970  Age: 53 y.o. MRN: [id number]  Chief Complaint  Patient presents with   Follow-up    Weight/zepbound . Pt complains that he is doing well on zepbound . Has slight nausea. States his appetite has decreased. 252 lb last visit. 236 today.     HPI  Obesity: Patient is currently maintained on Zepbound  50 mg weekly.  Patient's beginning weight with me was 259 pounds.  Patient did have a period of time when he is off of medication and were restarted titration at 2.5 mg in approximately November 2024 he was seen by me 11/26/2022 with a weight of 272.  Most recent office visit was 02/26/2023 with a weight of 252.  Patient is here for follow-up He is doing well with the injections. States that he have some nausea with over eating. States that he is having a BM twice daily to daily He is walking a lot with work and gardening at home He eating 1-2 times a day He is drinking coffee and water. He is doing 1-1.5 cups of black coffee and a couple of bottles of water a day  His goal weight is around 190 pounds    Review of Systems  Constitutional:  Negative for chills and fever.  Respiratory:  Negative for shortness of breath.   Cardiovascular:  Negative for chest pain.  Gastrointestinal:  Positive for nausea. Negative for abdominal pain, constipation, diarrhea and vomiting.  Neurological:  Negative for headaches.      Objective:     BP 120/70   Pulse 70   Temp 98 F (36.7 C) (Oral)   Ht 5\' 10"  (1.778 m)   Wt 236 lb 9.6 oz (107.3 kg)   SpO2 98%   BMI 33.95 kg/m  BP Readings from Last 3 Encounters:  05/27/23 120/70  02/26/23 122/70  11/26/22 124/84   Wt Readings from Last 3 Encounters:  05/27/23 236 lb 9.6 oz (107.3 kg)  02/26/23 252 lb 6.4 oz (114.5 kg)  11/26/22 272 lb (123.4 kg)   SpO2 Readings from Last 3 Encounters:  05/27/23 98%  02/26/23 98%  11/26/22 99%      Physical  Exam Vitals and nursing note reviewed.  Constitutional:      Appearance: Normal appearance.  Cardiovascular:     Rate and Rhythm: Normal rate and regular rhythm.     Heart sounds: Normal heart sounds.  Pulmonary:     Effort: Pulmonary effort is normal.     Breath sounds: Normal breath sounds.  Abdominal:     General: Bowel sounds are normal. There is no distension.     Palpations: There is no mass.     Tenderness: There is no abdominal tenderness.     Hernia: No hernia is present.  Neurological:     Mental Status: He is alert.      No results found for any visits on 05/27/23.    The 10-year ASCVD risk score (Arnett DK, et al., 2019) is: 8.5%    Assessment & Plan:   Problem List Items Addressed This Visit       Cardiovascular and Mediastinum   Essential hypertension - Primary   Relevant Orders   CBC   Comprehensive metabolic panel with GFR   TSH     Other   Prediabetes   Relevant Orders   Hemoglobin A1c   Hyperlipidemia   Relevant Orders   Lipid panel  Obesity (BMI 30-39.9)   Patient currently maintained on Zepbound  50 mg weekly.  He is tolerating medication well with side effect of nausea when he overeats or eats poorly.  Patient has had great weight reduction of approximately 40 pounds in 6 months.  Continue medication as prescribed continue working on healthy lifestyle modifications.      Other Visit Diagnoses       Screening for prostate cancer       Relevant Orders   PSA       Return in about 3 months (around 08/27/2023) for CPE.    Margarie Shay, NP

## 2023-05-28 ENCOUNTER — Ambulatory Visit: Payer: Self-pay | Admitting: Nurse Practitioner

## 2023-05-28 DIAGNOSIS — D72819 Decreased white blood cell count, unspecified: Secondary | ICD-10-CM

## 2023-05-28 DIAGNOSIS — E669 Obesity, unspecified: Secondary | ICD-10-CM

## 2023-05-31 NOTE — Telephone Encounter (Signed)
 Pt called back returning [facility] call regarding results. Although mychart shows pt read the result note, pt states he didn't read any messages on mychart. Relayed matt's message. Pt states he's never seen a hematologist before. Pt asked why was Tivis Forster concerned about the low white blood cell count? Pt states he needs more info on results from [place]. Please advise. Call back # [phone]

## 2023-06-01 NOTE — Telephone Encounter (Signed)
 He has chronically low white blood cells. This can be a normal variant in folks but I want to make sure that is the case and not something with the body having trouble making or maintaining white blood cells

## 2023-06-03 MED ORDER — ZEPBOUND 15 MG/0.5ML ~~LOC~~ SOAJ: 15.0000 mg | SUBCUTANEOUS | 0 refills | Status: DC

## 2023-06-10 ENCOUNTER — Inpatient Hospital Stay

## 2023-06-10 ENCOUNTER — Inpatient Hospital Stay: Attending: Oncology | Admitting: Oncology

## 2023-06-10 ENCOUNTER — Encounter: Payer: Self-pay | Admitting: Oncology

## 2023-06-10 VITALS — BP 128/73 | HR 59 | Temp 96.0°F | Resp 18 | Wt 235.1 lb

## 2023-06-10 DIAGNOSIS — I10 Essential (primary) hypertension: Secondary | ICD-10-CM | POA: Insufficient documentation

## 2023-06-10 DIAGNOSIS — D72819 Decreased white blood cell count, unspecified: Secondary | ICD-10-CM | POA: Diagnosis not present

## 2023-06-10 DIAGNOSIS — D472 Monoclonal gammopathy: Secondary | ICD-10-CM | POA: Insufficient documentation

## 2023-06-10 DIAGNOSIS — I252 Old myocardial infarction: Secondary | ICD-10-CM | POA: Insufficient documentation

## 2023-06-10 DIAGNOSIS — G4733 Obstructive sleep apnea (adult) (pediatric): Secondary | ICD-10-CM | POA: Diagnosis not present

## 2023-06-10 LAB — RETIC PANEL
Immature Retic Fract: 8 % (ref 2.3–15.9)
RBC.: 5.45 MIL/uL (ref 4.22–5.81)
Retic Count, Absolute: 70.3 10*3/uL (ref 19.0–186.0)
Retic Ct Pct: 1.3 % (ref 0.4–3.1)
Reticulocyte Hemoglobin: 30.5 pg (ref >27.9)

## 2023-06-10 LAB — HEPATITIS PANEL, ACUTE
HCV Ab: NONREACTIVE
Hep A IgM: NONREACTIVE
Hep B C IgM: NONREACTIVE
Hepatitis B Surface Ag: NONREACTIVE

## 2023-06-10 LAB — CBC WITH DIFFERENTIAL/PLATELET
Abs Immature Granulocytes: 0.03 10*3/uL (ref 0.00–0.07)
Basophils Absolute: 0 10*3/uL (ref 0.0–0.1)
Basophils Relative: 1 %
Eosinophils Absolute: 0.2 10*3/uL (ref 0.0–0.5)
Eosinophils Relative: 5 %
HCT: 46.3 % (ref 39.0–52.0)
Hemoglobin: 14.6 g/dL (ref 13.0–17.0)
Immature Granulocytes: 1 %
Lymphocytes Relative: 35 %
Lymphs Abs: 1.2 10*3/uL (ref 0.7–4.0)
MCH: 26.5 pg (ref 26.0–34.0)
MCHC: 31.5 g/dL (ref 30.0–36.0)
MCV: 84.2 fL (ref 80.0–100.0)
Monocytes Absolute: 0.3 10*3/uL (ref 0.1–1.0)
Monocytes Relative: 9 %
Neutro Abs: 1.7 10*3/uL (ref 1.7–7.7)
Neutrophils Relative %: 49 %
Platelets: 219 10*3/uL (ref 150–400)
RBC: 5.5 MIL/uL (ref 4.22–5.81)
RDW: 13.2 % (ref 11.5–15.5)
WBC: 3.4 10*3/uL — ABNORMAL LOW (ref 4.0–10.5)
nRBC: 0 % (ref 0.0–0.2)

## 2023-06-10 LAB — HIV ANTIBODY (ROUTINE TESTING W REFLEX): HIV Screen 4th Generation wRfx: NONREACTIVE

## 2023-06-10 LAB — VITAMIN B12: Vitamin B-12: 744 pg/mL (ref 180–914)

## 2023-06-10 LAB — LACTATE DEHYDROGENASE: LDH: 150 U/L (ref 98–192)

## 2023-06-10 LAB — FOLATE: Folate: 16.8 ng/mL (ref >5.9)

## 2023-06-10 NOTE — Progress Notes (Signed)
 Hematology/Oncology Consult Note [phone] [phone]    REFERRING PROVIDER: Dorothe Gaster, NP   CHIEF COMPLAINTS/REASON FOR VISIT:  Evaluation of leukopenia   ASSESSMENT & PLAN:   Leukopenia I discussed with patient that the differential diagnosis of leukopenia is broad, including acute or chronic infection, inflammation, nutrition deficiency, autoimmune disease,  ethnic, or malignant etiology including underlying bone morrow disorders.  For the work up of patient's leukoepenia, I recommend checking CBC;CMP, LDH; smear review, folate, Vitamin B12, hepatitis, HIV, flowcytometry and monoclonal gammopathy workup.     Orders Placed This Encounter  Procedures   CBC with Differential/Platelet    Standing Status:   Future    Number of Occurrences:   1    Expected Date:   06/10/2023    Expiration Date:   06/09/2024   HIV Antibody (routine testing w rflx)    Standing Status:   Future    Number of Occurrences:   1    Expected Date:   06/10/2023    Expiration Date:   06/09/2024   Hepatitis panel, acute    Standing Status:   Future    Number of Occurrences:   1    Expected Date:   06/10/2023    Expiration Date:   06/09/2024   Retic Panel    Standing Status:   Future    Number of Occurrences:   1    Expected Date:   06/10/2023    Expiration Date:   06/09/2024   Vitamin B12    Standing Status:   Future    Number of Occurrences:   1    Expected Date:   06/10/2023    Expiration Date:   06/09/2024   Folate    Standing Status:   Future    Number of Occurrences:   1    Expected Date:   06/10/2023    Expiration Date:   06/09/2024   Flow cytometry panel-leukemia/lymphoma work-up    Standing Status:   Future    Number of Occurrences:   1    Expected Date:   06/10/2023    Expiration Date:   06/09/2024   Protein electrophoresis, serum    Standing Status:   Future    Number of Occurrences:   1    Expected Date:   06/10/2023    Expiration Date:   06/09/2024   Lactate  dehydrogenase    Standing Status:   Future    Number of Occurrences:   1    Expected Date:   06/10/2023    Expiration Date:   06/09/2024   Follow up in 3-4 weeks.  All questions were answered. The patient knows to call the clinic with any problems, questions or concerns.  Timmy Forbes, MD, PhD [facility] Hematology Oncology 06/10/2023    HISTORY OF PRESENTING ILLNESS:  Samuel Wiley is a 53 y.o. male who was seen in consultation at the request of Dorothe Gaster, NP for evaluation of leukopenia Reviewed patient's recent labs. 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 Previous lab records reviewed.   Patient denies unintentional  weight loss, fever, chills. He has intentional lost 30-40 pounds over the past 4 months.  He reports history of COVID 19 infection x 7 times, last infection was a few months ago. He has had sinus infection in the past.  Denies history hepatitis or HIV infection Denies history of chronic liver disease Denies routine alcohol consumption. Denies dietary restrictions. Except  diary products.  Denies Herbal medication for several weeks, previously on Ginkgo biloba, Noopept,He takes acetylcholine several times per week.   Chronic history of brain fog since previous COVID 19 infection.    MEDICAL HISTORY:  Past Medical History:  Diagnosis Date   Anxiety and depression    looses weight with episodes, advised to see psychiatrist 01/2014   Asthma    childhood   Chicken pox    Diastolic dysfunction 11/30/2015   Grade 2 diastolic dysfunction on echo 08/2012.   GERD (gastroesophageal reflux disease)    Heart palpitations    History of echocardiogram    (5/09) showed EF 50-55%, mild LV dilation, mild LV hypertrophy, pseudonormal  diastolic function, mild mitral regurgitation, and mild left atrial enlargement.;  Echo 8/14:  Mild LVH, EF 55-60%, Gr 2 DD, mild MR, mild LAE   Hypertension    high blood pressure readings    Mild mitral  regurgitation by prior echocardiogram    Obesity    Obstructive sleep apnea    Perioperative myocardial infarction    Perioperative troponin elevation in 2007 after the patient's uvuloplasty.  He did have some chest pain at the time as well.  Left heart catheterization at that time showed normal coronary arteries and normal renal  arteries, EF was 50%.    SURGICAL HISTORY: Past Surgical History:  Procedure Laterality Date   CARDIAC CATHETERIZATION     EF of 50% with Normal coronary arteries, normal renal arteries   COLONOSCOPY WITH PROPOFOL  N/A 03/06/2022   Procedure: COLONOSCOPY WITH PROPOFOL ;  Surgeon: Luke Salaam, MD;  Location: [facility] END[facility];  Service: Gastroenterology;  Laterality: N/A;   MASS EXCISION Left 10/09/2014   Procedure: EXCISION LEFT NECK LIPOMA;  Surgeon: Prescott Brodie, MD;  Location: [facility];  Service: ENT;  Laterality: Left;    SOCIAL HISTORY: Social History   Socioeconomic History   Marital status: Single    Spouse name: Not on file   Number of children: 0   Years of education: bachelors   Highest education level: Not on file  Occupational History   Not on file  Tobacco Use   Smoking status: Never   Smokeless tobacco: Never  Vaping Use   Vaping status: Never Used  Substance and Sexual Activity   Alcohol use: Not Currently    Comment: have not drank alcohol for years.   Drug use: Never   Sexual activity: Not Currently  Other Topics Concern   Not on file  Social History Narrative   01/08/20   From: in [place], since 1990   Living: alone   Work: Art gallery manager at [employer]       Family: mother passed away, [place]   Has friend network in the area      Enjoys: gardening      Exercise: weight lifting - squats daily, more activity during gardening   Diet: beans, meat      Safety   Seat belts: Yes    Guns: Yes  and not currently secure   Safe in relationships: Yes             Social Drivers of Health    Financial Resource Strain: Low Risk  (06/10/2023)   Overall Financial Resource Strain (CARDIA)    Difficulty of Paying Living Expenses: Not very hard  Food Insecurity: No Food Insecurity (06/10/2023)   Hunger Vital Sign    Worried About Running Out of Food in the Last Year: Never true  Ran Out of Food in the Last Year: Never true  Transportation Needs: No Transportation Needs (06/10/2023)   PRAPARE - Administrator, Civil Service (Medical): No    Lack of Transportation (Non-Medical): No  Physical Activity: Not on file  Stress: No Stress Concern Present (06/10/2023)   [employer] of Occupational Health - Occupational Stress Questionnaire    Feeling of Stress : Only a little  Social Connections: Not on file  Intimate Partner Violence: Not At Risk (06/10/2023)   Humiliation, Afraid, Rape, and Kick questionnaire    Fear of Current or Ex-Partner: No    Emotionally Abused: No    Physically Abused: No    Sexually Abused: No    FAMILY HISTORY: Family History  Problem Relation Age of Onset   Hypertension Father        family history of early onset   Diabetes Father    Hypertension Mother    Diabetes Mother    Stroke Brother 40   Hypertension Brother    Heart disease Maternal Grandfather    Heart disease Paternal Grandfather     ALLERGIES:  is allergic to atorvastatin  calcium , lisinopril, milk protein, and clavulanic acid.  MEDICATIONS:  Current Outpatient Medications  Medication Sig Dispense Refill   arginine 500 MG tablet Take 1,000 mg by mouth 3 (three) times daily.      aspirin 81 MG tablet Take 81 mg by mouth daily.     finasteride (PROSCAR) 5 MG tablet Take 5 mg by mouth daily.     hydrochlorothiazide  (HYDRODIURIL ) 25 MG tablet TAKE 1 TABLET (25 MG TOTAL) BY MOUTH DAILY. 30 tablet 8   nebivolol  (BYSTOLIC ) 5 MG tablet TAKE 1 TABLET (5 MG TOTAL) BY MOUTH DAILY. 30 tablet 5   oxybutynin (DITROPAN) 5 MG tablet TAKE 1 TABLET BY MOUTH EVERY 8 HOURS AS NEEDED  FOR BLADDER SPASMS     Sildenafil  Citrate (VIAGRA  PO) Take by mouth as needed.     terazosin  (HYTRIN ) 10 MG capsule Take 1 capsule (10 mg total) by mouth in the morning and at bedtime. 60 capsule 0   tirzepatide  (ZEPBOUND ) 15 MG/0.5ML Pen Inject 15 mg into the skin once a week. 6 mL 0   No current facility-administered medications for this visit.     Review of Systems  Constitutional:  Positive for fatigue. Negative for appetite change, chills, fever and unexpected weight change.  HENT:   Negative for hearing loss and voice change.   Eyes:  Negative for eye problems and icterus.  Respiratory:  Negative for chest tightness, cough and shortness of breath.   Cardiovascular:  Negative for chest pain and leg swelling.  Gastrointestinal:  Negative for abdominal distention and abdominal pain.  Endocrine: Negative for hot flashes.  Genitourinary:  Negative for difficulty urinating, dysuria and frequency.   Musculoskeletal:  Negative for arthralgias.  Skin:  Negative for itching and rash.  Neurological:  Negative for light-headedness and numbness.  Hematological:  Negative for adenopathy. Does not bruise/bleed easily.  Psychiatric/Behavioral:  Negative for confusion.     PHYSICAL EXAMINATION: ECOG PERFORMANCE STATUS:  Vitals:   06/10/23 0924  BP: 128/73  Pulse: (!) 59  Resp: 18  Temp: (!) 96 F (35.6 C)  SpO2: 100%   Filed Weights   06/10/23 0924  Weight: 235 lb 1.6 oz (106.6 kg)     Physical Exam  RADIOGRAPHIC STUDIES: I have personally reviewed the radiological images as listed and agreed with the findings in the report. No results  found.   LABORATORY DATA:  I have reviewed the data as listed    Latest Ref Rng & Units 05/27/2023    8:35 AM 01/30/2022    4:23 PM 01/25/2018   11:23 AM  CBC  WBC 4.0 - 10.5 K/uL 2.7  3.7  3.5   Hemoglobin 13.0 - 17.0 g/dL 82.9  56.2  13.0   Hematocrit 39.0 - 52.0 % 42.1  40.3  44.1   Platelets 150.0 - 400.0 K/uL 199.0  231  190.0        Latest Ref Rng & Units 05/27/2023    8:35 AM 03/26/2022   10:14 AM 01/30/2022    4:23 PM  CMP  Glucose 70 - 99 mg/dL 94  865  94   BUN 6 - 23 mg/dL 21  17  12    Creatinine 0.40 - 1.50 mg/dL 7.84  6.96  2.95   Sodium 135 - 145 mEq/L 134  135  138   Potassium 3.5 - 5.1 mEq/L 3.9  4.0  4.6   Chloride 96 - 112 mEq/L 96  99  104   CO2 19 - 32 mEq/L 31  29  25    Calcium  8.4 - 10.5 mg/dL 9.2  9.3  9.4   Total Protein 6.0 - 8.3 g/dL 7.1   7.2   Total Bilirubin 0.2 - 1.2 mg/dL 0.8   0.5   Alkaline Phos 39 - 117 U/L 64     AST 0 - 37 U/L 14   22   ALT 0 - 53 U/L 15   31        RADIOGRAPHIC STUDIES: I have personally reviewed the radiological images as listed and agreed with the findings in the report. No results found.

## 2023-06-10 NOTE — Assessment & Plan Note (Signed)
I discussed with patient that the differential diagnosis of leukopenia is broad, including acute or chronic infection, inflammation, nutrition deficiency, autoimmune disease,  ethnic, or malignant etiology including underlying bone morrow disorders.  For the work up of patient's leukoepenia, I recommend checking CBC;CMP, LDH; smear review, folate, Vitamin B12, hepatitis, HIV, flowcytometry and monoclonal gammopathy workup.   

## 2023-06-11 LAB — PROTEIN ELECTROPHORESIS, SERUM
A/G Ratio: 1.1 (ref 0.7–1.7)
Albumin ELP: 4 g/dL (ref 2.9–4.4)
Alpha-1-Globulin: 0.2 g/dL (ref 0.0–0.4)
Alpha-2-Globulin: 0.6 g/dL (ref 0.4–1.0)
Beta Globulin: 1 g/dL (ref 0.7–1.3)
Gamma Globulin: 1.8 g/dL (ref 0.4–1.8)
Globulin, Total: 3.6 g/dL (ref 2.2–3.9)
M-Spike, %: 1.3 g/dL — ABNORMAL HIGH
Total Protein ELP: 7.6 g/dL (ref 6.0–8.5)

## 2023-06-15 LAB — COMP PANEL: LEUKEMIA/LYMPHOMA

## 2023-06-23 ENCOUNTER — Ambulatory Visit: Payer: Self-pay | Admitting: Oncology

## 2023-06-23 DIAGNOSIS — D72819 Decreased white blood cell count, unspecified: Secondary | ICD-10-CM

## 2023-06-24 NOTE — Telephone Encounter (Signed)
 Called pt, no answer. Detailed message left on ph for pt to call back and set up lab appt. Mychart message also sent.

## 2023-06-24 NOTE — Telephone Encounter (Signed)
-----   Message from Timmy Forbes sent at 06/23/2023  9:52 PM EDT ----- M protein on labs. Please arrange him to get myeloma panel, light chain ration, 24 hour UPEP. Thanks.

## 2023-06-25 ENCOUNTER — Inpatient Hospital Stay: Attending: Oncology

## 2023-06-25 DIAGNOSIS — D472 Monoclonal gammopathy: Secondary | ICD-10-CM | POA: Diagnosis not present

## 2023-06-25 DIAGNOSIS — D72819 Decreased white blood cell count, unspecified: Secondary | ICD-10-CM | POA: Diagnosis not present

## 2023-06-28 LAB — MULTIPLE MYELOMA PANEL, SERUM
Albumin SerPl Elph-Mcnc: 3.5 g/dL (ref 2.9–4.4)
Albumin/Glob SerPl: 1.1 (ref 0.7–1.7)
Alpha 1: 0.2 g/dL (ref 0.0–0.4)
Alpha2 Glob SerPl Elph-Mcnc: 0.6 g/dL (ref 0.4–1.0)
B-Globulin SerPl Elph-Mcnc: 1 g/dL (ref 0.7–1.3)
Gamma Glob SerPl Elph-Mcnc: 1.7 g/dL (ref 0.4–1.8)
Globulin, Total: 3.5 g/dL (ref 2.2–3.9)
IgA: 109 mg/dL (ref 90–386)
IgG (Immunoglobin G), Serum: 1944 mg/dL — ABNORMAL HIGH (ref 603–1613)
IgM (Immunoglobulin M), Srm: 31 mg/dL (ref 20–172)
M Protein SerPl Elph-Mcnc: 0.9 g/dL — ABNORMAL HIGH
Total Protein ELP: 7 g/dL (ref 6.0–8.5)

## 2023-06-28 LAB — KAPPA/LAMBDA LIGHT CHAINS
Kappa free light chain: 15.2 mg/L (ref 3.3–19.4)
Kappa, lambda light chain ratio: 0.65 (ref 0.26–1.65)
Lambda free light chains: 23.5 mg/L (ref 5.7–26.3)

## 2023-07-06 ENCOUNTER — Other Ambulatory Visit
Admission: RE | Admit: 2023-07-06 | Discharge: 2023-07-06 | Disposition: A | Discharge status: Home / Self Care | Source: Ambulatory Visit | Attending: Oncology | Admitting: Oncology

## 2023-07-06 DIAGNOSIS — D72819 Decreased white blood cell count, unspecified: Secondary | ICD-10-CM | POA: Insufficient documentation

## 2023-07-07 ENCOUNTER — Inpatient Hospital Stay: Admitting: Oncology

## 2023-07-07 ENCOUNTER — Encounter: Payer: Self-pay | Admitting: Oncology

## 2023-07-07 VITALS — BP 141/83 | HR 61 | Temp 97.9°F | Resp 18 | Wt 237.2 lb

## 2023-07-07 DIAGNOSIS — D72819 Decreased white blood cell count, unspecified: Secondary | ICD-10-CM | POA: Diagnosis not present

## 2023-07-07 DIAGNOSIS — D472 Monoclonal gammopathy: Secondary | ICD-10-CM

## 2023-07-07 LAB — IFE+PROTEIN ELECTRO, 24-HR UR
% BETA, Urine: 31.2 %
ALPHA 1 URINE: 6.1 %
Albumin, U: 20.2 %
Alpha 2, Urine: 19.1 %
GAMMA GLOBULIN URINE: 23.4 %
Total Protein, Urine-Ur/day: 79 mg/(24.h) (ref 30–150)
Total Protein, Urine: 11.3 mg/dL

## 2023-07-07 NOTE — Assessment & Plan Note (Signed)
 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 Normal B12 and folate level.  Negative flow cytometry.  Observation.

## 2023-07-07 NOTE — Assessment & Plan Note (Addendum)
 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 Less frequently, these patients progress to AL amyloidosis, light chain deposition disease, or another lymphoproliferative disorder.  Lab Results  Component Value Date   MPROTEIN 0.9 (H) 06/25/2023   KPAFRELGTCHN 15.2 06/25/2023   LAMBDASER 23.5 06/25/2023   KAPLAMBRATIO 0.65 06/25/2023  24-hour urine protein electrophoresis showed negative M protein. For now I recommend observation. Check SPEP and light chain ratio every 6 months.

## 2023-07-07 NOTE — Progress Notes (Signed)
 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 Less frequently, these patients progress to AL amyloidosis, light chain deposition disease, or another lymphoproliferative disorder.  Lab Results  Component Value Date   MPROTEIN 0.9 (H) 06/25/2023   KPAFRELGTCHN 15.2 06/25/2023   LAMBDASER 23.5 06/25/2023   KAPLAMBRATIO 0.65 06/25/2023    For now I recommend observation. Check SPEP and light chain ratio every 6 months.    Leukopenia Mild leukopenia with no decrease of subgroups. Normal B12 and folate level.  Negative flow cytometry.  Observation.   Orders Placed This Encounter  Procedures   CBC with Differential (Cancer Center Only)    Standing Status:   Future    Expected Date:   11/06/2023    Expiration Date:   02/04/2024   CMP (Cancer Center only)    Standing Status:   Future    Expected Date:   11/06/2023    Expiration Date:   02/04/2024   Kappa/lambda light chains    Standing Status:   Future    Expected Date:   11/06/2023    Expiration Date:   02/04/2024   Multiple Myeloma Panel (SPEP&IFE w/QIG)    Standing Status:   Future    Expected Date:   11/06/2023    Expiration Date:   02/04/2024   IFE+PROTEIN ELECTRO, 24-HR UR    Standing Status:   Future    Expected Date:   11/06/2023    Expiration Date:   02/04/2024   Follow up in 6 months.  All questions were answered. The patient knows to call the clinic with any problems, questions or concerns.  Zelphia Cap, MD, PhD [facility] Hematology Oncology 07/07/2023    HISTORY OF PRESENTING ILLNESS:  Samuel Wiley is a  53 y.o. male who was seen in consultation at the request of Wendee Lynwood HERO, NP for evaluation of leukopenia Reviewed patient's recent labs. 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 Previous lab records reviewed.   Patient denies unintentional  weight loss, fever, chills. He has intentional lost 30-40 pounds over the past 4 months.  He reports history of COVID 19 infection x 7 times, last infection was a few months ago. He has had sinus infection in the past.  Denies history hepatitis or HIV infection Denies history of chronic liver disease Denies routine alcohol consumption. Denies dietary restrictions. Except diary products.  Denies Herbal medication for several weeks, previously on Ginkgo biloba, Noopept,He takes acetylcholine several times per week.   Chronic history of brain fog since previous COVID 19 infection.     INTERVAL HISTORY Samuel Wiley is a 53 y.o. male who has above history reviewed by me today presents for follow up visit for leukopenia workup He has no new complaints.  Presented discussed results.  MEDICAL HISTORY:  Past Medical History:  Diagnosis Date   Anxiety and depression    looses weight with episodes, advised to see psychiatrist 01/2014   Asthma    childhood   Chicken pox    Diastolic dysfunction 11/30/2015   Grade 2 diastolic dysfunction on echo 08/2012.   GERD (gastroesophageal  reflux disease)    Heart palpitations    History of echocardiogram    (5/09) showed EF 50-55%, mild LV dilation, mild LV hypertrophy, pseudonormal  diastolic function, mild mitral regurgitation, and mild left atrial enlargement.;  Echo 8/14:  Mild LVH, EF 55-60%, Gr 2 DD, mild MR, mild LAE   Hypertension    high blood pressure readings    Mild mitral regurgitation by prior echocardiogram    Obesity    Obstructive sleep apnea    Perioperative myocardial infarction    Perioperative troponin elevation in 2007 after the patient's uvuloplasty.  He  did have some chest pain at the time as well.  Left heart catheterization at that time showed normal coronary arteries and normal renal  arteries, EF was 50%.    SURGICAL HISTORY: Past Surgical History:  Procedure Laterality Date   CARDIAC CATHETERIZATION     EF of 50% with Normal coronary arteries, normal renal arteries   COLONOSCOPY WITH PROPOFOL  N/A 03/06/2022   Procedure: COLONOSCOPY WITH PROPOFOL ;  Surgeon: Therisa Bi, MD;  Location: [facility];  Service: Gastroenterology;  Laterality: N/A;   MASS EXCISION Left 10/09/2014   Procedure: EXCISION LEFT NECK LIPOMA;  Surgeon: Lonni FORBES Angle, MD;  Location: [facility];  Service: ENT;  Laterality: Left;    SOCIAL HISTORY: Social History   Socioeconomic History   Marital status: Single    Spouse name: Not on file   Number of children: 0   Years of education: bachelors   Highest education level: Not on file  Occupational History   Not on file  Tobacco Use   Smoking status: Never   Smokeless tobacco: Never  Vaping Use   Vaping status: Never Used  Substance and Sexual Activity   Alcohol use: Not Currently    Comment: have not drank alcohol for years.   Drug use: Never   Sexual activity: Not Currently  Other Topics Concern   Not on file  Social History Narrative   01/08/20   From: in [place], since 1990   Living: alone   Work: Art gallery manager at [employer]       Family: mother passed away, [place]   Has friend network in the area      Enjoys: gardening      Exercise: weight lifting - squats daily, more activity during gardening   Diet: beans, meat      Safety   Seat belts: Yes    Guns: Yes  and not currently secure   Safe in relationships: Yes             Social Drivers of Corporate investment banker Strain: Low Risk  (06/10/2023)   Overall Financial Resource Strain (CARDIA)    Difficulty of Paying Living Expenses: Not very hard  Food Insecurity: No Food Insecurity (06/10/2023)    Hunger Vital Sign    Worried About Running Out of Food in the Last Year: Never true    Ran Out of Food in the Last Year: Never true  Transportation Needs: No Transportation Needs (06/10/2023)   PRAPARE - Administrator, Civil Service (Medical): No    Lack of Transportation (Non-Medical): No  Physical Activity: Not on file  Stress: No Stress Concern Present (06/10/2023)   [employer] of Occupational Health - Occupational Stress Questionnaire    Feeling of Stress : Only a little  Social Connections: Not on file  Intimate Partner Violence: Not At Risk (06/10/2023)   Humiliation, Afraid,  Rape, and Kick questionnaire    Fear of Current or Ex-Partner: No    Emotionally Abused: No    Physically Abused: No    Sexually Abused: No    FAMILY HISTORY: Family History  Problem Relation Age of Onset   Hypertension Father        family history of early onset   Diabetes Father    Hypertension Mother    Diabetes Mother    Stroke Brother 66   Hypertension Brother    Heart disease Maternal Grandfather    Heart disease Paternal Grandfather     ALLERGIES:  is allergic to atorvastatin  calcium , lisinopril, milk protein, and clavulanic acid.  MEDICATIONS:  Current Outpatient Medications  Medication Sig Dispense Refill   arginine 500 MG tablet Take 1,000 mg by mouth 3 (three) times daily.      aspirin 81 MG tablet Take 81 mg by mouth daily.     finasteride (PROSCAR) 5 MG tablet Take 5 mg by mouth daily.     hydrochlorothiazide  (HYDRODIURIL ) 25 MG tablet TAKE 1 TABLET (25 MG TOTAL) BY MOUTH DAILY. 30 tablet 8   nebivolol  (BYSTOLIC ) 5 MG tablet TAKE 1 TABLET (5 MG TOTAL) BY MOUTH DAILY. 30 tablet 5   oxybutynin (DITROPAN) 5 MG tablet TAKE 1 TABLET BY MOUTH EVERY 8 HOURS AS NEEDED FOR BLADDER SPASMS     Sildenafil  Citrate (VIAGRA  PO) Take by mouth as needed.     terazosin  (HYTRIN ) 10 MG capsule Take 1 capsule (10 mg total) by mouth in the morning and at bedtime. 60 capsule 0    tirzepatide  (ZEPBOUND ) 15 MG/0.5ML Pen Inject 15 mg into the skin once a week. 6 mL 0   No current facility-administered medications for this visit.     Review of Systems  Constitutional:  Positive for fatigue. Negative for appetite change, chills, fever and unexpected weight change.  HENT:   Negative for hearing loss and voice change.   Eyes:  Negative for eye problems and icterus.  Respiratory:  Negative for chest tightness, cough and shortness of breath.   Cardiovascular:  Negative for chest pain and leg swelling.  Gastrointestinal:  Negative for abdominal distention and abdominal pain.  Endocrine: Negative for hot flashes.  Genitourinary:  Negative for difficulty urinating, dysuria and frequency.   Musculoskeletal:  Negative for arthralgias.  Skin:  Negative for itching and rash.  Neurological:  Negative for light-headedness and numbness.  Hematological:  Negative for adenopathy. Does not bruise/bleed easily.  Psychiatric/Behavioral:  Negative for confusion.     PHYSICAL EXAMINATION: ECOG PERFORMANCE STATUS:  Vitals:   07/07/23 1308  BP: (!) 141/83  Pulse: 61  Resp: 18  Temp: 97.9 F (36.6 C)  SpO2: 99%   Filed Weights   07/07/23 1308  Weight: 237 lb 3.2 oz (107.6 kg)     Physical Exam  RADIOGRAPHIC STUDIES: I have personally reviewed the radiological images as listed and agreed with the findings in the report. No results found.   LABORATORY DATA:  I have reviewed the data as listed    Latest Ref Rng & Units 06/10/2023    9:59 AM 05/27/2023    8:35 AM 01/30/2022    4:23 PM  CBC  WBC 4.0 - 10.5 K/uL 3.4  2.7  3.7   Hemoglobin 13.0 - 17.0 g/dL 85.3  86.0  86.3   Hematocrit 39.0 - 52.0 % 46.3  42.1  40.3   Platelets 150 - 400 K/uL 219  199.0  231  Latest Ref Rng & Units 05/27/2023    8:35 AM 03/26/2022   10:14 AM 01/30/2022    4:23 PM  CMP  Glucose 70 - 99 mg/dL 94  891  94   BUN 6 - 23 mg/dL 21  17  12    Creatinine 0.40 - 1.50 mg/dL 8.87  8.80  8.80    Sodium 135 - 145 mEq/L 134  135  138   Potassium 3.5 - 5.1 mEq/L 3.9  4.0  4.6   Chloride 96 - 112 mEq/L 96  99  104   CO2 19 - 32 mEq/L 31  29  25    Calcium  8.4 - 10.5 mg/dL 9.2  9.3  9.4   Total Protein 6.0 - 8.3 g/dL 7.1   7.2   Total Bilirubin 0.2 - 1.2 mg/dL 0.8   0.5   Alkaline Phos 39 - 117 U/L 64     AST 0 - 37 U/L 14   22   ALT 0 - 53 U/L 15   31        RADIOGRAPHIC STUDIES: I have personally reviewed the radiological images as listed and agreed with the findings in the report. No results found.

## 2023-07-09 ENCOUNTER — Ambulatory Visit: Payer: Self-pay

## 2023-07-09 ENCOUNTER — Other Ambulatory Visit: Payer: Self-pay | Admitting: Nurse Practitioner

## 2023-07-09 DIAGNOSIS — E669 Obesity, unspecified: Secondary | ICD-10-CM

## 2023-07-09 NOTE — Telephone Encounter (Signed)
 FYI Only or Action Required?: FYI only for provider.  Patient was last seen in primary care on 05/27/2023 by Wendee Lynwood HERO, NP. Called Nurse Triage reporting sinus concern. Symptoms began about a month ago. Interventions attempted: OTC medications: See note . Symptoms are: stable.  Triage Disposition: See PCP When Office is Open (Within 3 Days) See note   Patient/caregiver understands and will follow disposition?: Yes    --------------------------------------------   Copied from CRM [id number]. Topic: Clinical - Red Word Triage >> Jul 09, 2023  9:31 AM Ivette P wrote: Red Word that prompted transfer to Nurse Triage: Yellowish green mucus, believes has sinus infection. Reason for Disposition  [1] Nasal discharge AND [2] present > 10 days  Answer Assessment - Initial Assessment Questions 1. LOCATION: Where does it hurt?     -------------------------- R. Nostril    2. ONSET: When did the sinus pain start?  (e.g., hours, days)      ------- one month    3. SEVERITY: How bad is the pain?   (Scale 1-10; mild, moderate or severe)   - MILD (1-3): doesn't interfere with normal activities    - MODERATE (4-7): interferes with normal activities (e.g., work or school) or awakens from sleep   - SEVERE (8-10): excruciating pain and patient unable to do any normal activities        ------------------------------ Denies     4. RECURRENT SYMPTOM: Have you ever had sinus problems before? If Yes, ask: When was the last time? and What happened that time?      ------    5. NASAL CONGESTION: Is the nose blocked? If Yes, ask: Can you open it or must you breathe through your mouth? ------- Yes       6. NASAL DISCHARGE: Do you have discharge from your nose? If so ask, What color?     --- Yellow and green mucus   7. FEVER: Do you have a fever? If Yes, ask: What is it, how was it measured, and when did it start?      ------------------ Unsure    8. OTHER SYMPTOMS: Do you  have any other symptoms? (e.g., sore throat, cough, earache, difficulty breathing)     ---- Pressure below R. Eye --------- Headache: 1.5/ 10     Additional Infor:  --- Attempted: Naproxen, Nasal Spray- Minimal relief  Protocols used: Sinus Pain or Congestion-A-AH

## 2023-07-09 NOTE — Telephone Encounter (Signed)
 Copied from CRM [id number]. Topic: Clinical - Medication Refill >> Jul 09, 2023  9:30 AM Samuel Wiley wrote: Medication: tirzepatide  (ZEPBOUND ) 15 MG/0.5ML Pen   Has the patient contacted their pharmacy? No (Agent: If no, request that the patient contact the pharmacy for the refill. If patient does not wish to contact the pharmacy document the reason why and proceed with request.) (Agent: If yes, when and what did the pharmacy advise?)  This is the patient's preferred pharmacy:  [id number] - [place] - [address] AT [facility] [address] [place] [address] Phone: [phone] Fax: [phone]  Is this the correct pharmacy for this prescription? Yes If no, delete pharmacy and type the correct one.   Has the prescription been filled recently? No  Is the patient out of the medication? Yes, last dose was Sunday   Has the patient been seen for an appointment in the last year OR does the patient have an upcoming appointment? Yes  Can we respond through MyChart? Yes  Agent: Please be advised that Rx refills may take up to 3 business days. We ask that you follow-up with your pharmacy.

## 2023-07-09 NOTE — Telephone Encounter (Signed)
 I wrote the patient a 3 month supply. Can we call the pharmacy and see what they dispensed

## 2023-07-09 NOTE — Telephone Encounter (Signed)
 Noted. I appreciate Dr. Severo evaluation

## 2023-07-12 ENCOUNTER — Ambulatory Visit: Admitting: Internal Medicine

## 2023-07-12 ENCOUNTER — Encounter: Payer: Self-pay | Admitting: Internal Medicine

## 2023-07-12 VITALS — BP 110/82 | HR 68 | Temp 98.5°F | Ht 69.5 in | Wt 240.0 lb

## 2023-07-12 DIAGNOSIS — J329 Chronic sinusitis, unspecified: Secondary | ICD-10-CM | POA: Insufficient documentation

## 2023-07-12 DIAGNOSIS — J324 Chronic pansinusitis: Secondary | ICD-10-CM | POA: Diagnosis not present

## 2023-07-12 MED ORDER — DOXYCYCLINE HYCLATE 100 MG PO TABS: 100.0000 mg | ORAL_TABLET | Freq: Two times a day (BID) | ORAL | 1 refills | Status: AC

## 2023-07-12 NOTE — Progress Notes (Signed)
 Subjective:    Patient ID: Samuel Wiley, male    DOB: 1970/11/26, 53 y.o.   MRN: [id number]  HPI Here due to sinus issues  Has had sinus issues for years Past surgery Now has persistent symptoms--goes back 6 months If he leans over--gets bad smelling yellow discharge Blows purulent stuff out in the morning--but can still have it drain later Right side has the yellow green--clear from left--bad smell Post nasal drip when he looks up Some right maxillary pain  Current Outpatient Medications on File Prior to Visit  Medication Sig Dispense Refill   arginine 500 MG tablet Take 1,000 mg by mouth 3 (three) times daily.      aspirin 81 MG tablet Take 81 mg by mouth daily.     finasteride (PROSCAR) 5 MG tablet Take 5 mg by mouth daily.     hydrochlorothiazide  (HYDRODIURIL ) 25 MG tablet TAKE 1 TABLET (25 MG TOTAL) BY MOUTH DAILY. 30 tablet 8   nebivolol  (BYSTOLIC ) 5 MG tablet TAKE 1 TABLET (5 MG TOTAL) BY MOUTH DAILY. 30 tablet 5   oxybutynin (DITROPAN) 5 MG tablet TAKE 1 TABLET BY MOUTH EVERY 8 HOURS AS NEEDED FOR BLADDER SPASMS     Sildenafil  Citrate (VIAGRA  PO) Take by mouth as needed.     terazosin  (HYTRIN ) 10 MG capsule Take 1 capsule (10 mg total) by mouth in the morning and at bedtime. 60 capsule 0   tirzepatide  (ZEPBOUND ) 15 MG/0.5ML Pen Inject 15 mg into the skin once a week. 6 mL 0   No current facility-administered medications on file prior to visit.    Allergies  Allergen Reactions   Atorvastatin  Calcium      Cognitive issues   Lisinopril Cough   Milk Protein    Clavulanic Acid Diarrhea    Past Medical History:  Diagnosis Date   Anxiety and depression    looses weight with episodes, advised to see psychiatrist 01/2014   Asthma    childhood   Chicken pox    Diastolic dysfunction 11/30/2015   Grade 2 diastolic dysfunction on echo 08/2012.   GERD (gastroesophageal reflux disease)    Heart palpitations    History of echocardiogram    (5/09) showed EF 50-55%, mild  LV dilation, mild LV hypertrophy, pseudonormal  diastolic function, mild mitral regurgitation, and mild left atrial enlargement.;  Echo 8/14:  Mild LVH, EF 55-60%, Gr 2 DD, mild MR, mild LAE   Hypertension    high blood pressure readings    Mild mitral regurgitation by prior echocardiogram    Obesity    Obstructive sleep apnea    Perioperative myocardial infarction    Perioperative troponin elevation in 2007 after the patient's uvuloplasty.  He did have some chest pain at the time as well.  Left heart catheterization at that time showed normal coronary arteries and normal renal  arteries, EF was 50%.    Past Surgical History:  Procedure Laterality Date   CARDIAC CATHETERIZATION     EF of 50% with Normal coronary arteries, normal renal arteries   COLONOSCOPY WITH PROPOFOL  N/A 03/06/2022   Procedure: COLONOSCOPY WITH PROPOFOL ;  Surgeon: Therisa Bi, MD;  Location[facility];  Service: Gastroenterology;  Laterality: N/A;   MASS EXCISION Left 10/09/2014   Procedure: EXCISION LEFT NECK LIPOMA;  Surgeon: Lonni FORBES Angle, MD;  Location: [facility];  Service: ENT;  Laterality: Left;   UVULOPALATOPHARYNGOPLASTY  2010   and turbniate reduction    Family History  Problem Relation Age of  Onset   Hypertension Father        family history of early onset   Diabetes Father    Hypertension Mother    Diabetes Mother    Stroke Brother 30   Hypertension Brother    Heart disease Maternal Grandfather    Heart disease Paternal Grandfather     Social History   Socioeconomic History   Marital status: Single    Spouse name: Not on file   Number of children: 0   Years of education: bachelors   Highest education level: Not on file  Occupational History   Not on file  Tobacco Use   Smoking status: Never   Smokeless tobacco: Never  Vaping Use   Vaping status: Never Used  Substance and Sexual Activity   Alcohol use: Not Currently    Comment: have not drank alcohol for  years.   Drug use: Never   Sexual activity: Not Currently  Other Topics Concern   Not on file  Social History Narrative   01/08/20   From: in [place], since 1990   Living: alone   Work: Art gallery manager at [employer]       Family: mother passed away, family [place]   Has friend network in the area      Enjoys: gardening      Exercise: weight lifting - squats daily, more activity during gardening   Diet: beans, meat      Safety   Seat belts: Yes    Guns: Yes  and not currently secure   Safe in relationships: Yes             Social Drivers of Corporate investment banker Strain: Low Risk  (06/10/2023)   Overall Financial Resource Strain (CARDIA)    Difficulty of Paying Living Expenses: Not very hard  Food Insecurity: No Food Insecurity (06/10/2023)   Hunger Vital Sign    Worried About Running Out of Food in the Last Year: Never true    Ran Out of Food in the Last Year: Never true  Transportation Needs: No Transportation Needs (06/10/2023)   PRAPARE - Administrator, Civil Service (Medical): No    Lack of Transportation (Non-Medical): No  Physical Activity: Not on file  Stress: No Stress Concern Present (06/10/2023)   [employer] of Occupational Health - Occupational Stress Questionnaire    Feeling of Stress : Only a little  Social Connections: Not on file  Intimate Partner Violence: Not At Risk (06/10/2023)   Humiliation, Afraid, Rape, and Kick questionnaire    Fear of Current or Ex-Partner: No    Emotionally Abused: No    Physically Abused: No    Sexually Abused: No   Review of Systems Sleeps with CPAP---keeps it clean No fever Slight headache     Objective:   Physical Exam Constitutional:      Appearance: Normal appearance.  HENT:     Head:     Comments: Right frontal tenderness    Right Ear: Tympanic membrane and ear canal normal.     Left Ear: Tympanic membrane and ear canal normal.     Nose:     Comments: Turbinates small on right--some  green mucus there    Mouth/Throat:     Pharynx: No oropharyngeal exudate or posterior oropharyngeal erythema.  Pulmonary:     Effort: Pulmonary effort is normal.     Breath sounds: Normal breath sounds. No wheezing or rales.   Musculoskeletal:     Cervical  back: Neck supple.  Lymphadenopathy:     Cervical: No cervical adenopathy.   Neurological:     Mental Status: He is alert.            Assessment & Plan:

## 2023-07-12 NOTE — Assessment & Plan Note (Addendum)
 Ongoing sinus symptoms and purulent secretions Will treat with doxy 100  bid x 10 days (with refill) If ongoing symptoms, would send back to ENT (not sure which [facility] doctor did this)

## 2023-07-12 NOTE — Telephone Encounter (Signed)
 Pt was in to see Dr Jimmy today and said he did not receive 3 months. He said he will talk to the pharmacy.

## 2023-07-13 ENCOUNTER — Telehealth: Payer: Self-pay

## 2023-07-13 ENCOUNTER — Other Ambulatory Visit (HOSPITAL_COMMUNITY): Payer: Self-pay

## 2023-07-13 NOTE — Telephone Encounter (Signed)
 Pharmacy Patient Advocate Encounter  Received notification from [facility] that Prior Authorization for Zepbound  15 has been APPROVED from 06/13/23 to 07/12/24. Ran test claim, Copay is $24.99. This test claim was processed through [facility]- copay amounts may vary at other pharmacies due to pharmacy/plan contracts, or as the patient moves through the different stages of their insurance plan.   PA #/Case ID/Reference #: [id number]

## 2023-07-13 NOTE — Telephone Encounter (Signed)
 Pharmacy Patient Advocate Encounter   Received notification from CoverMyMeds that prior authorization for Zepbound  15 is required/requested.   Insurance verification completed.   The patient is insured through [employer] .   Per test claim: PA required; PA submitted to above mentioned insurance via CoverMyMeds Key/confirmation #/EOC [id number] Status is pending

## 2023-07-26 NOTE — Telephone Encounter (Signed)
 Will not let me done message

## 2023-09-15 ENCOUNTER — Encounter: Payer: Self-pay | Admitting: Nurse Practitioner

## 2023-09-15 DIAGNOSIS — I10 Essential (primary) hypertension: Secondary | ICD-10-CM

## 2023-09-15 DIAGNOSIS — E669 Obesity, unspecified: Secondary | ICD-10-CM

## 2023-09-17 MED ORDER — NEBIVOLOL HCL 5 MG PO TABS: 5.0000 mg | ORAL_TABLET | Freq: Every day | ORAL | 5 refills | Status: AC

## 2023-09-17 MED ORDER — TERAZOSIN HCL 10 MG PO CAPS: 10.0000 mg | ORAL_CAPSULE | Freq: Two times a day (BID) | ORAL | 0 refills | Status: DC

## 2023-09-20 MED ORDER — ZEPBOUND 15 MG/0.5ML ~~LOC~~ SOAJ: 15.0000 mg | SUBCUTANEOUS | 0 refills | Status: DC

## 2023-09-20 NOTE — Addendum Note (Signed)
 Addended by: WENDEE LYNWOOD HERO on: 09/20/2023 12:59 PM   Modules accepted: Orders

## 2023-10-05 ENCOUNTER — Encounter: Payer: Self-pay | Admitting: Nurse Practitioner

## 2023-10-05 ENCOUNTER — Ambulatory Visit: Admitting: Nurse Practitioner

## 2023-10-05 VITALS — BP 118/72 | HR 58 | Temp 97.5°F | Ht 69.0 in | Wt 242.6 lb

## 2023-10-05 DIAGNOSIS — E785 Hyperlipidemia, unspecified: Secondary | ICD-10-CM

## 2023-10-05 DIAGNOSIS — N400 Enlarged prostate without lower urinary tract symptoms: Secondary | ICD-10-CM

## 2023-10-05 DIAGNOSIS — R7303 Prediabetes: Secondary | ICD-10-CM | POA: Diagnosis not present

## 2023-10-05 DIAGNOSIS — Z125 Encounter for screening for malignant neoplasm of prostate: Secondary | ICD-10-CM | POA: Diagnosis not present

## 2023-10-05 DIAGNOSIS — E669 Obesity, unspecified: Secondary | ICD-10-CM

## 2023-10-05 DIAGNOSIS — I10 Essential (primary) hypertension: Secondary | ICD-10-CM | POA: Diagnosis not present

## 2023-10-05 DIAGNOSIS — Z23 Encounter for immunization: Secondary | ICD-10-CM

## 2023-10-05 DIAGNOSIS — Z0000 Encounter for general adult medical examination without abnormal findings: Secondary | ICD-10-CM | POA: Diagnosis not present

## 2023-10-05 LAB — CBC WITH DIFFERENTIAL/PLATELET
Basophils Absolute: 0 K/uL (ref 0.0–0.1)
Basophils Relative: 0.8 % (ref 0.0–3.0)
Eosinophils Absolute: 0.2 K/uL (ref 0.0–0.7)
Eosinophils Relative: 5.7 % — ABNORMAL HIGH (ref 0.0–5.0)
HCT: 42 % (ref 39.0–52.0)
Hemoglobin: 13.6 g/dL (ref 13.0–17.0)
Lymphocytes Relative: 34.6 % (ref 12.0–46.0)
Lymphs Abs: 1.2 K/uL (ref 0.7–4.0)
MCHC: 32.4 g/dL (ref 30.0–36.0)
MCV: 82.1 fl (ref 78.0–100.0)
Monocytes Absolute: 0.3 K/uL (ref 0.1–1.0)
Monocytes Relative: 9.5 % (ref 3.0–12.0)
Neutro Abs: 1.7 K/uL (ref 1.4–7.7)
Neutrophils Relative %: 49.4 % (ref 43.0–77.0)
Platelets: 209 K/uL (ref 150.0–400.0)
RBC: 5.12 Mil/uL (ref 4.22–5.81)
RDW: 14.5 % (ref 11.5–15.5)
WBC: 3.5 K/uL — ABNORMAL LOW (ref 4.0–10.5)

## 2023-10-05 LAB — PSA: PSA: 0.7 ng/mL (ref 0.10–4.00)

## 2023-10-05 LAB — COMPREHENSIVE METABOLIC PANEL WITH GFR
ALT: 22 U/L (ref 0–53)
AST: 17 U/L (ref 0–37)
Albumin: 4 g/dL (ref 3.5–5.2)
Alkaline Phosphatase: 64 U/L (ref 39–117)
BUN: 14 mg/dL (ref 6–23)
CO2: 30 meq/L (ref 19–32)
Calcium: 9.3 mg/dL (ref 8.4–10.5)
Chloride: 101 meq/L (ref 96–112)
Creatinine, Ser: 1.26 mg/dL (ref 0.40–1.50)
GFR: 64.54 mL/min (ref >60.00)
Glucose, Bld: 101 mg/dL — ABNORMAL HIGH (ref 70–99)
Potassium: 4 meq/L (ref 3.5–5.1)
Sodium: 137 meq/L (ref 135–145)
Total Bilirubin: 0.5 mg/dL (ref 0.2–1.2)
Total Protein: 7 g/dL (ref 6.0–8.3)

## 2023-10-05 LAB — LIPID PANEL
Cholesterol: 154 mg/dL (ref 0–200)
HDL: 42.1 mg/dL (ref >39.00)
LDL Cholesterol: 105 mg/dL — ABNORMAL HIGH (ref 0–99)
NonHDL: 111.42
Total CHOL/HDL Ratio: 4
Triglycerides: 30 mg/dL (ref 0.0–149.0)
VLDL: 6 mg/dL (ref 0.0–40.0)

## 2023-10-05 LAB — HEMOGLOBIN A1C: Hgb A1c MFr Bld: 6.1 % (ref 4.6–6.5)

## 2023-10-05 LAB — TSH: TSH: 0.47 u[IU]/mL (ref 0.35–5.50)

## 2023-10-05 NOTE — Assessment & Plan Note (Signed)
 History of the same.  Patient is working on diet and exercising.  Pending A1c

## 2023-10-05 NOTE — Assessment & Plan Note (Signed)
 History of the same patient has been working on lifestyle modifications.  He is exercising and working on his diet.  Continue tirzepatide  15 mg weekly.

## 2023-10-05 NOTE — Progress Notes (Signed)
 Established Patient Office Visit  Subjective   Patient ID: Samuel Wiley, male    DOB: 16-Sep-1970  Age: 53 y.o. MRN: [id number]  Chief Complaint  Patient presents with   Annual Exam    Flu vaccine     HPI  HTN: Patient currently maintained on hydrochlorothiazide  25 mg daily, Nebivolol  5 mg daily. Does check blood pressure at home several times a week  OAB/BPH: Currently maintained on finasteride 5 mg daily, oxybutynin  5 mg twice daily and terazosin  10 mg twice daily. States that he feels like it is controlled. States that he would have difficulty urinating.  Obesity: Patient currently maintained on tirzepatide  15 mg weekly.  for complete physical and follow up of chronic conditions.  Immunizations: -Tetanus: Completed in 2015 -Influenza: up date today -Shingles: discussed in office  -Pneumonia: Completed 2022  Diet: Fair diet. He is eating 2 meals a day. He does not snack. He is doing water and coffee and unsweet.  Exercise: No regular exercise. He is oding weights 2-3 times a week. He will do pushups. 35 mins   Eye exam: needs updating   Dental exam: Completes semi-annually    Colonoscopy: Completed in 03/06/2022, repeat 7 years.  Patient due 2031 Lung Cancer Screening: NA   PSA: Due  Sleep: going to bed around 930 and get up aournd 5. Feels rested sometimes.  Earlier he has his last meal he sleeps better.          Review of Systems  Constitutional:  Negative for chills and fever.  Respiratory:  Negative for shortness of breath.   Cardiovascular:  Negative for chest pain and leg swelling.  Gastrointestinal:  Negative for abdominal pain, blood in stool, constipation, diarrhea, nausea and vomiting.       BM daily   Genitourinary:  Negative for dysuria and hematuria.  Neurological:  Negative for dizziness, tingling and headaches.  Psychiatric/Behavioral:  Negative for hallucinations and suicidal ideas.       Objective:     BP 118/72   Pulse (!) 58    Temp (!) 97.5 F (36.4 C) (Oral)   Ht 5' 9 (1.753 m)   Wt 242 lb 9.6 oz (110 kg)   SpO2 98%   BMI 35.83 kg/m  BP Readings from Last 3 Encounters:  10/05/23 118/72  07/12/23 110/82  07/07/23 (!) 141/83   Wt Readings from Last 3 Encounters:  10/05/23 242 lb 9.6 oz (110 kg)  07/12/23 240 lb (108.9 kg)  07/07/23 237 lb 3.2 oz (107.6 kg)   SpO2 Readings from Last 3 Encounters:  10/05/23 98%  07/12/23 97%  07/07/23 99%      Physical Exam Vitals and nursing note reviewed.  Constitutional:      Appearance: Normal appearance.  HENT:     Right Ear: Tympanic membrane, ear canal and external ear normal.     Left Ear: Tympanic membrane, ear canal and external ear normal.     Mouth/Throat:     Mouth: Mucous membranes are moist.     Pharynx: Oropharynx is clear.  Eyes:     Extraocular Movements: Extraocular movements intact.     Pupils: Pupils are equal, round, and reactive to light.  Cardiovascular:     Rate and Rhythm: Normal rate and regular rhythm.     Pulses:          Radial pulses are 2+ on the right side and 2+ on the left side.     Heart sounds: Normal heart sounds.  Pulmonary:     Effort: Pulmonary effort is normal.     Breath sounds: Normal breath sounds.  Abdominal:     General: Bowel sounds are normal. There is no distension.     Palpations: There is no mass.     Tenderness: There is no abdominal tenderness.     Hernia: No hernia is present.  Genitourinary:    Comments: deferred Musculoskeletal:     Right lower leg: No edema.     Left lower leg: No edema.  Lymphadenopathy:     Cervical: No cervical adenopathy.  Skin:    General: Skin is warm.  Neurological:     General: No focal deficit present.     Mental Status: He is alert.     Deep Tendon Reflexes:     Reflex Scores:      Bicep reflexes are 2+ on the right side and 2+ on the left side.      Patellar reflexes are 2+ on the right side and 2+ on the left side.    Comments: Bilateral upper and lower  extremity strength 5/5  Psychiatric:        Mood and Affect: Mood normal.        Behavior: Behavior normal.        Thought Content: Thought content normal.        Judgment: Judgment normal.      No results found for any visits on 10/05/23.    The ASCVD Risk score (Arnett DK, et al., 2019) failed to calculate for the following reasons:   Risk score cannot be calculated because patient has a medical history suggesting prior/existing ASCVD    Assessment & Plan:   Problem List Items Addressed This Visit       Cardiovascular and Mediastinum   Essential hypertension   Patient currently maintained on HCTZ 25 mg daily and Nebivolol  5 mg daily.  Blood pressure well-controlled.  Continue medication as prescribed      Relevant Orders   CBC with Differential/Platelet   Comprehensive metabolic panel with GFR   Hemoglobin A1c   TSH   Lipid panel     Genitourinary   BPH (benign prostatic hyperplasia)   History of same currently maintained on finasteride, oxybutynin , terazosin .  Symptoms seem controlled.  Continue medication as prescribed pending PSA today      Relevant Orders   PSA     Other   Prediabetes   History of the same.  Patient is working on diet and exercising.  Pending A1c      Relevant Orders   Hemoglobin A1c   Lipid panel   Hyperlipidemia   History of the same pending lipid panel today continue working on healthy lifestyle modifications      Relevant Orders   Lipid panel   Preventative health care - Primary   Discussed age-appropriate immunizations and screening exams.  Did review patient's personal, surgical, social, family histories.  Patient is up-to-date with all age-appropriate vaccinations he would like.  Administer flu vaccine and Tdap today.  Patient will get shingles vaccine at a later time.  Patient is up-to-date on CRC screening.  PSA for prostate cancer screening today.  Patient was given information at discharge about preventative healthcare  maintenance with anticipatory guidance.      Relevant Orders   CBC with Differential/Platelet   Comprehensive metabolic panel with GFR   TSH   Obesity (BMI 30-39.9)   History of the same patient has been working on lifestyle modifications.  He is exercising and working on his diet.  Continue tirzepatide  15 mg weekly.      Relevant Orders   Hemoglobin A1c   TSH   Lipid panel   Other Visit Diagnoses       Need for diphtheria-tetanus-pertussis (Tdap) vaccine       Relevant Orders   Tdap vaccine greater than or equal to 7yo IM (Completed)     Screening for prostate cancer       Relevant Orders   PSA     Need for influenza vaccination       Relevant Orders   Flu vaccine trivalent PF, 6mos and older(Flulaval,Afluria,Fluarix,Fluzone) (Completed)       Return in about 3 months (around 01/04/2024) for weight/zepbound .    Adina Crandall, NP

## 2023-10-05 NOTE — Patient Instructions (Signed)
 Nice to see you today I will be in touch with the labs once I have them Follow up with me in 3 months sooner if you need me

## 2023-10-05 NOTE — Assessment & Plan Note (Signed)
 Patient currently maintained on HCTZ 25 mg daily and Nebivolol  5 mg daily.  Blood pressure well-controlled.  Continue medication as prescribed

## 2023-10-05 NOTE — Assessment & Plan Note (Signed)
 History of the same pending lipid panel today continue working on healthy lifestyle modifications

## 2023-10-05 NOTE — Assessment & Plan Note (Signed)
 Discussed age-appropriate immunizations and screening exams.  Did review patient's personal, surgical, social, family histories.  Patient is up-to-date with all age-appropriate vaccinations he would like.  Administer flu vaccine and Tdap today.  Patient will get shingles vaccine at a later time.  Patient is up-to-date on CRC screening.  PSA for prostate cancer screening today.  Patient was given information at discharge about preventative healthcare maintenance with anticipatory guidance.

## 2023-10-05 NOTE — Assessment & Plan Note (Signed)
 History of same currently maintained on finasteride, oxybutynin , terazosin .  Symptoms seem controlled.  Continue medication as prescribed pending PSA today

## 2023-10-06 NOTE — Telephone Encounter (Signed)
 I have pended for 90 day didn't put in refills. Ok to send?

## 2023-10-06 NOTE — Addendum Note (Signed)
 Addended by: SEBASTIAN DANNA GRADE on: 10/06/2023 09:02 AM   Modules accepted: Orders

## 2023-10-07 ENCOUNTER — Ambulatory Visit: Payer: Self-pay | Admitting: Nurse Practitioner

## 2023-10-07 MED ORDER — OXYBUTYNIN CHLORIDE 5 MG PO TABS: 5.0000 mg | ORAL_TABLET | Freq: Two times a day (BID) | ORAL | 1 refills | Status: AC

## 2023-11-08 ENCOUNTER — Inpatient Hospital Stay: Attending: Oncology

## 2023-11-08 DIAGNOSIS — D72819 Decreased white blood cell count, unspecified: Secondary | ICD-10-CM | POA: Diagnosis present

## 2023-11-08 DIAGNOSIS — D472 Monoclonal gammopathy: Secondary | ICD-10-CM

## 2023-11-08 LAB — CMP (CANCER CENTER ONLY)
ALT: 26 U/L (ref 0–44)
AST: 19 U/L (ref 15–41)
Albumin: 3.7 g/dL (ref 3.5–5.0)
Alkaline Phosphatase: 65 U/L (ref 38–126)
Anion gap: 5 (ref 5–15)
BUN: 21 mg/dL — ABNORMAL HIGH (ref 6–20)
CO2: 27 mmol/L (ref 22–32)
Calcium: 8.9 mg/dL (ref 8.9–10.3)
Chloride: 104 mmol/L (ref 98–111)
Creatinine: 1.09 mg/dL (ref 0.61–1.24)
GFR, Estimated: >60 mL/min (ref >60)
Glucose, Bld: 96 mg/dL (ref 70–99)
Potassium: 3.7 mmol/L (ref 3.5–5.1)
Sodium: 136 mmol/L (ref 135–145)
Total Bilirubin: 0.5 mg/dL (ref 0.0–1.2)
Total Protein: 7.3 g/dL (ref 6.5–8.1)

## 2023-11-08 LAB — CBC WITH DIFFERENTIAL (CANCER CENTER ONLY)
Abs Immature Granulocytes: 0.02 K/uL (ref 0.00–0.07)
Basophils Absolute: 0 K/uL (ref 0.0–0.1)
Basophils Relative: 1 %
Eosinophils Absolute: 0.2 K/uL (ref 0.0–0.5)
Eosinophils Relative: 6 %
HCT: 44.3 % (ref 39.0–52.0)
Hemoglobin: 14.1 g/dL (ref 13.0–17.0)
Immature Granulocytes: 1 %
Lymphocytes Relative: 35 %
Lymphs Abs: 1.2 K/uL (ref 0.7–4.0)
MCH: 27.1 pg (ref 26.0–34.0)
MCHC: 31.8 g/dL (ref 30.0–36.0)
MCV: 85 fL (ref 80.0–100.0)
Monocytes Absolute: 0.4 K/uL (ref 0.1–1.0)
Monocytes Relative: 10 %
Neutro Abs: 1.6 K/uL — ABNORMAL LOW (ref 1.7–7.7)
Neutrophils Relative %: 47 %
Platelet Count: 200 K/uL (ref 150–400)
RBC: 5.21 MIL/uL (ref 4.22–5.81)
RDW: 13.3 % (ref 11.5–15.5)
WBC Count: 3.4 K/uL — ABNORMAL LOW (ref 4.0–10.5)
nRBC: 0 % (ref 0.0–0.2)

## 2023-11-09 LAB — KAPPA/LAMBDA LIGHT CHAINS
Kappa free light chain: 15.2 mg/L (ref 3.3–19.4)
Kappa, lambda light chain ratio: 0.57 (ref 0.26–1.65)
Lambda free light chains: 26.8 mg/L — ABNORMAL HIGH (ref 5.7–26.3)

## 2023-11-09 LAB — MISC LABCORP TEST (SEND OUT): Labcorp test code: 143000

## 2023-11-11 LAB — MULTIPLE MYELOMA PANEL, SERUM
Albumin SerPl Elph-Mcnc: 3.5 g/dL (ref 2.9–4.4)
Albumin/Glob SerPl: 1.1 (ref 0.7–1.7)
Alpha 1: 0.2 g/dL (ref 0.0–0.4)
Alpha2 Glob SerPl Elph-Mcnc: 0.6 g/dL (ref 0.4–1.0)
B-Globulin SerPl Elph-Mcnc: 0.9 g/dL (ref 0.7–1.3)
Gamma Glob SerPl Elph-Mcnc: 1.6 g/dL (ref 0.4–1.8)
Globulin, Total: 3.3 g/dL (ref 2.2–3.9)
IgA: 104 mg/dL (ref 90–386)
IgG (Immunoglobin G), Serum: 2015 mg/dL — ABNORMAL HIGH (ref 603–1613)
IgM (Immunoglobulin M), Srm: 38 mg/dL (ref 20–172)
M Protein SerPl Elph-Mcnc: 1.1 g/dL — ABNORMAL HIGH
Total Protein ELP: 6.8 g/dL (ref 6.0–8.5)

## 2023-11-14 ENCOUNTER — Other Ambulatory Visit: Payer: Self-pay | Admitting: Nurse Practitioner

## 2023-11-14 DIAGNOSIS — I10 Essential (primary) hypertension: Secondary | ICD-10-CM

## 2023-11-18 ENCOUNTER — Encounter: Payer: Self-pay | Admitting: Oncology

## 2023-11-18 ENCOUNTER — Inpatient Hospital Stay: Attending: Oncology | Admitting: Oncology

## 2023-11-18 VITALS — BP 130/84 | HR 64 | Temp 96.6°F | Resp 18 | Wt 234.9 lb

## 2023-11-18 DIAGNOSIS — D472 Monoclonal gammopathy: Secondary | ICD-10-CM | POA: Diagnosis not present

## 2023-11-18 DIAGNOSIS — D72819 Decreased white blood cell count, unspecified: Secondary | ICD-10-CM | POA: Diagnosis not present

## 2023-11-18 NOTE — Assessment & Plan Note (Signed)
 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 Normal B12 and folate level.  Negative flow cytometry.  Observation.

## 2023-11-18 NOTE — Assessment & Plan Note (Addendum)
 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 Less frequently, these patients progress to AL amyloidosis, light chain deposition disease, or another lymphoproliferative disorder.  Lab Results  Component Value Date   MPROTEIN 1.1 (H) 11/08/2023   KPAFRELGTCHN 15.2 11/08/2023   LAMBDASER 26.8 (H) 11/08/2023   KAPLAMBRATIO 0.57 11/08/2023  Previous 24-hour urine protein electrophoresis showed negative M protein. M protein gradually increases.  For now I recommend observation. Check SPEP and light chain ratio in 4 months

## 2023-11-18 NOTE — Progress Notes (Signed)
 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 Less frequently, these patients progress to AL amyloidosis, light chain deposition disease, or another lymphoproliferative disorder.  Lab Results  Component Value Date   MPROTEIN 1.1 (H) 11/08/2023   KPAFRELGTCHN 15.2 11/08/2023   LAMBDASER 26.8 (H) 11/08/2023   KAPLAMBRATIO 0.57 11/08/2023  Previous 24-hour urine protein electrophoresis showed negative M protein. M protein gradually increases.  For now I recommend observation. Check SPEP and light chain ratio in 4 months    Leukopenia Mild leukopenia with no decrease of subgroups. Normal B12 and folate level.  Negative flow cytometry.  Observation.   Orders Placed This Encounter  Procedures   CMP (Cancer Center only)    Standing Status:   Future    Expected Date:   03/17/2024    Expiration Date:   06/15/2024   CBC with Differential (Cancer Center Only)    Standing Status:   Future    Expected Date:   03/17/2024    Expiration Date:   06/15/2024   Multiple Myeloma Panel (SPEP&IFE w/QIG)    Standing Status:   Future    Expected Date:   03/17/2024    Expiration Date:   06/15/2024   Kappa/lambda light chains    Standing Status:   Future    Expected Date:   03/17/2024    Expiration Date:   06/15/2024   Follow up in 4 months.  All questions were answered. The patient knows to call the clinic with any problems, questions or concerns.  Zelphia Cap, MD, PhD [facility] Hematology Oncology 11/18/2023    HISTORY OF PRESENTING ILLNESS:  Samuel Wiley is a 53 y.o. male who was seen in  consultation at the request of Wendee Lynwood HERO, NP for evaluation of leukopenia Reviewed patient's recent labs. 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 Previous lab records reviewed.   Patient denies unintentional  weight loss, fever, chills. He has intentional lost 30-40 pounds over the past 4 months.  He reports history of COVID 19 infection x 7 times, last infection was a few months ago. He has had sinus infection in the past.  Denies history hepatitis or HIV infection Denies history of chronic liver disease Denies routine alcohol consumption. Denies dietary restrictions. Except diary products.  Denies Herbal medication for several weeks, previously on Ginkgo biloba, Noopept,He takes acetylcholine several times per week.   Chronic history of brain fog since previous COVID 19 infection.     INTERVAL HISTORY Samuel Wiley is a 53 y.o. male who has above history reviewed by me today presents for follow up visit for leukopenia workup He has no new complaints.  Presented discussed results.  MEDICAL HISTORY:  Past Medical History:  Diagnosis Date   Anxiety and depression    looses weight with episodes, advised to see psychiatrist 01/2014   Asthma    childhood   Chicken pox    Diastolic dysfunction 11/30/2015   Grade 2 diastolic dysfunction on echo 08/2012.   GERD (gastroesophageal reflux disease)    Heart palpitations    History of echocardiogram    (5/09)  showed EF 50-55%, mild LV dilation, mild LV hypertrophy, pseudonormal  diastolic function, mild mitral regurgitation, and mild left atrial enlargement.;  Echo 8/14:  Mild LVH, EF 55-60%, Gr 2 DD, mild MR, mild LAE   Hypertension    high blood pressure readings    Mild mitral regurgitation by prior echocardiogram    Obesity    Obstructive sleep apnea    Perioperative myocardial infarction    Perioperative troponin elevation in 2007 after the patient's uvuloplasty.  He did have some chest pain at the  time as well.  Left heart catheterization at that time showed normal coronary arteries and normal renal  arteries, EF was 50%.    SURGICAL HISTORY: Past Surgical History:  Procedure Laterality Date   CARDIAC CATHETERIZATION     EF of 50% with Normal coronary arteries, normal renal arteries   COLONOSCOPY WITH PROPOFOL  N/A 03/06/2022   Procedure: COLONOSCOPY WITH PROPOFOL ;  Surgeon: Therisa Bi, MD;  Location: [facility];  Service: Gastroenterology;  Laterality: N/A;   MASS EXCISION Left 10/09/2014   Procedure: EXCISION LEFT NECK LIPOMA;  Surgeon: Lonni FORBES Angle, MD;  Location: [facility];  Service: ENT;  Laterality: Left;   UVULOPALATOPHARYNGOPLASTY  2010   and turbniate reduction    SOCIAL HISTORY: Social History   Socioeconomic History   Marital status: Single    Spouse name: Not on file   Number of children: 0   Years of education: bachelors   Highest education level: Not on file  Occupational History   Not on file  Tobacco Use   Smoking status: Never   Smokeless tobacco: Never  Vaping Use   Vaping status: Never Used  Substance and Sexual Activity   Alcohol use: Not Currently    Comment: have not drank alcohol for years.   Drug use: Never   Sexual activity: Not Currently  Other Topics Concern   Not on file  Social History Narrative   01/08/20   From: in [place], since 1990   Living: alone   Work: art gallery manager at the [employer]       Family: mother passed away, [place]   Has friend network in the area      Enjoys: gardening      Exercise: weight lifting - squats daily, more activity during gardening   Diet: beans, meat      Safety   Seat belts: Yes    Guns: Yes  and not currently secure   Safe in relationships: Yes             Social Drivers of Health   Financial Resource Strain: Patient Declined (10/05/2023)   Overall Financial Resource Strain (CARDIA)    Difficulty of Paying Living Expenses: Patient declined  Food  Insecurity: Patient Declined (10/05/2023)   Hunger Vital Sign    Worried About Running Out of Food in the Last Year: Patient declined    Ran Out of Food in the Last Year: Patient declined  Transportation Needs: Patient Declined (10/05/2023)   PRAPARE - Administrator, Civil Service (Medical): Patient declined    Lack of Transportation (Non-Medical): Patient declined  Physical Activity: Unknown (10/05/2023)   Exercise Vital Sign    Days of Exercise per Week: Patient declined    Minutes of Exercise per Session: Not on file  Stress: Patient Declined (10/05/2023)   [employer] of Occupational Health - Occupational Stress Questionnaire    Feeling of Stress: Patient declined  Social Connections: Unknown (10/05/2023)  Social Advertising Account Executive    Frequency of Communication with Friends and Family: Patient declined    Frequency of Social Gatherings with Friends and Family: Patient declined    Attends Religious Services: Patient declined    Database Administrator or Organizations: Patient declined    Attends Banker Meetings: Not on file    Marital Status: Patient declined  Intimate Partner Violence: Not At Risk (06/10/2023)   Humiliation, Afraid, Rape, and Kick questionnaire    Fear of Current or Ex-Partner: No    Emotionally Abused: No    Physically Abused: No    Sexually Abused: No    FAMILY HISTORY: Family History  Problem Relation Age of Onset   Hypertension Father        family history of early onset   Diabetes Father    Hypertension Mother    Diabetes Mother    Stroke Brother 40   Hypertension Brother    Heart disease Maternal Grandfather    Heart disease Paternal Grandfather     ALLERGIES:  is allergic to atorvastatin  calcium , lisinopril, milk protein, and clavulanic acid.  MEDICATIONS:  Current Outpatient Medications  Medication Sig Dispense Refill   arginine 500 MG tablet Take 1,000 mg by mouth 3 (three) times daily.  (Patient  taking differently: Take 1,000 mg by mouth 3 (three) times daily. Patient takes this when lifting weights.)     aspirin 81 MG tablet Take 81 mg by mouth daily.     finasteride (PROSCAR) 5 MG tablet Take 5 mg by mouth daily.     hydrochlorothiazide  (HYDRODIURIL ) 25 MG tablet TAKE 1 TABLET (25 MG TOTAL) BY MOUTH DAILY. 90 tablet 1   nebivolol  (BYSTOLIC ) 5 MG tablet Take 1 tablet (5 mg total) by mouth daily. 30 tablet 5   oxybutynin  (DITROPAN ) 5 MG tablet Take 1 tablet (5 mg total) by mouth 2 (two) times daily. 180 tablet 1   Sildenafil  Citrate (VIAGRA  PO) Take by mouth as needed.     terazosin  (HYTRIN ) 10 MG capsule Take 1 capsule (10 mg total) by mouth in the morning and at bedtime. 60 capsule 0   tirzepatide  (ZEPBOUND ) 15 MG/0.5ML Pen Inject 15 mg into the skin once a week. 2 mL 0   doxycycline  (VIBRA -TABS) 100 MG tablet Take 1 tablet (100 mg total) by mouth 2 (two) times daily. (Patient not taking: Reported on 11/18/2023) 20 tablet 1   No current facility-administered medications for this visit.     Review of Systems  Constitutional:  Positive for fatigue. Negative for appetite change, chills, fever and unexpected weight change.  HENT:   Negative for hearing loss and voice change.   Eyes:  Negative for eye problems and icterus.  Respiratory:  Negative for chest tightness, cough and shortness of breath.   Cardiovascular:  Negative for chest pain and leg swelling.  Gastrointestinal:  Negative for abdominal distention and abdominal pain.  Endocrine: Negative for hot flashes.  Genitourinary:  Negative for difficulty urinating, dysuria and frequency.   Musculoskeletal:  Negative for arthralgias.  Skin:  Negative for itching and rash.  Neurological:  Negative for light-headedness and numbness.  Hematological:  Negative for adenopathy. Does not bruise/bleed easily.  Psychiatric/Behavioral:  Negative for confusion.     PHYSICAL EXAMINATION: ECOG PERFORMANCE STATUS:  Vitals:   11/18/23 1341   BP: 130/84  Pulse: 64  Resp: 18  Temp: (!) 96.6 F (35.9 C)  SpO2: 99%   Filed Weights   11/18/23 1341  Weight: 234 lb 14.4 oz (106.5 kg)     Physical Exam  RADIOGRAPHIC STUDIES: I have personally reviewed the radiological images as listed and agreed with the findings in the report. No results found.   LABORATORY DATA:  I have reviewed the data as listed    Latest Ref Rng & Units 11/08/2023    8:10 AM 10/05/2023    8:48 AM 06/10/2023    9:59 AM  CBC  WBC 4.0 - 10.5 K/uL 3.4  3.5  3.4   Hemoglobin 13.0 - 17.0 g/dL 85.8  86.3  85.3   Hematocrit 39.0 - 52.0 % 44.3  42.0  46.3   Platelets 150 - 400 K/uL 200  209.0  219       Latest Ref Rng & Units 11/08/2023    8:09 AM 10/05/2023    8:48 AM 05/27/2023    8:35 AM  CMP  Glucose 70 - 99 mg/dL 96  898  94   BUN 6 - 20 mg/dL 21  14  21    Creatinine 0.61 - 1.24 mg/dL 8.90  8.73  8.87   Sodium 135 - 145 mmol/L 136  137  134   Potassium 3.5 - 5.1 mmol/L 3.7  4.0  3.9   Chloride 98 - 111 mmol/L 104  101  96   CO2 22 - 32 mmol/L 27  30  31    Calcium  8.9 - 10.3 mg/dL 8.9  9.3  9.2   Total Protein 6.5 - 8.1 g/dL 7.3  7.0  7.1   Total Bilirubin 0.0 - 1.2 mg/dL 0.5  0.5  0.8   Alkaline Phos 38 - 126 U/L 65  64  64   AST 15 - 41 U/L 19  17  14    ALT 0 - 44 U/L 26  22  15         RADIOGRAPHIC STUDIES: I have personally reviewed the radiological images as listed and agreed with the findings in the report. No results found.

## 2023-11-22 ENCOUNTER — Telehealth: Payer: Self-pay

## 2023-11-22 ENCOUNTER — Other Ambulatory Visit: Payer: Self-pay | Admitting: Nurse Practitioner

## 2023-11-22 DIAGNOSIS — E669 Obesity, unspecified: Secondary | ICD-10-CM

## 2023-11-22 MED ORDER — ZEPBOUND 15 MG/0.5ML ~~LOC~~ SOAJ: 15.0000 mg | SUBCUTANEOUS | 1 refills | Status: AC

## 2023-11-22 NOTE — Telephone Encounter (Signed)
 LAST APPOINTMENT DATE: 10/05/2023   NEXT APPOINTMENT DATE: Visit date not found  Zepbound  0.5   LAST REFILL: 09/20/2023  QTY: 2mL , 0RF

## 2023-11-22 NOTE — Addendum Note (Signed)
 Addended by: WENDEE LYNWOOD HERO on: 11/22/2023 01:20 PM   Modules accepted: Orders

## 2024-01-14 ENCOUNTER — Other Ambulatory Visit: Payer: Self-pay | Admitting: Nurse Practitioner

## 2024-01-14 DIAGNOSIS — I10 Essential (primary) hypertension: Secondary | ICD-10-CM

## 2024-02-14 ENCOUNTER — Other Ambulatory Visit: Payer: Self-pay | Admitting: Nurse Practitioner

## 2024-02-14 DIAGNOSIS — I10 Essential (primary) hypertension: Secondary | ICD-10-CM

## 2024-02-17 ENCOUNTER — Other Ambulatory Visit: Payer: Self-pay | Admitting: *Deleted

## 2024-02-17 ENCOUNTER — Other Ambulatory Visit: Payer: Self-pay | Admitting: Nurse Practitioner

## 2024-02-17 DIAGNOSIS — E669 Obesity, unspecified: Secondary | ICD-10-CM

## 2024-03-14 ENCOUNTER — Inpatient Hospital Stay

## 2024-03-23 ENCOUNTER — Inpatient Hospital Stay: Admitting: Oncology
